# Patient Record
Sex: Female | Born: 1985
Health system: Southern US, Community
[De-identification: ages and names within clinical notes are randomized; demographics above are authoritative.]

## PROBLEM LIST (undated history)

## (undated) DIAGNOSIS — O24419 Gestational diabetes mellitus in pregnancy, unspecified control: Secondary | ICD-10-CM

## (undated) HISTORY — DX: Gestational diabetes mellitus in pregnancy, unspecified control: O24.419

---

## 2016-04-23 HISTORY — PX: CHOLECYSTECTOMY: SHX55

## 2017-12-05 ENCOUNTER — Other Ambulatory Visit (HOSPITAL_COMMUNITY)
Admission: RE | Admit: 2017-12-05 | Discharge: 2017-12-05 | Disposition: A | Discharge status: Home / Self Care | Payer: No Typology Code available for payment source | Source: Ambulatory Visit | Attending: Nurse Practitioner | Admitting: Nurse Practitioner

## 2017-12-05 ENCOUNTER — Other Ambulatory Visit: Payer: Self-pay | Admitting: Nurse Practitioner

## 2017-12-05 DIAGNOSIS — Z01419 Encounter for gynecological examination (general) (routine) without abnormal findings: Secondary | ICD-10-CM | POA: Insufficient documentation

## 2017-12-09 LAB — CYTOLOGY - PAP
Diagnosis: NEGATIVE
HPV (WINDOPATH): NOT DETECTED

## 2018-04-23 NOTE — L&D Delivery Note (Signed)
Delivery Note At 10:31 AM a viable female was delivered via Vaginal, Spontaneous (Presentation:   Left Occiput Anterior).  APGAR: 9, 9; weight pending.   Placenta status: Spontaneous, Intact.  Cord: 3 vessels with the following complications: nuchal x1  Anesthesia: None Episiotomy: None Lacerations:  none Suture Repair: n/a Est. Blood Loss (mL): 50  Mom to postpartum.  Baby to Couplet care / Skin to Skin.  Wende Mott CNM 04/19/2019, 10:58 AM

## 2018-07-15 ENCOUNTER — Ambulatory Visit (INDEPENDENT_AMBULATORY_CARE_PROVIDER_SITE_OTHER): Payer: Self-pay | Admitting: Physician Assistant

## 2018-07-15 ENCOUNTER — Encounter: Payer: Self-pay | Admitting: Physician Assistant

## 2018-07-15 VITALS — BP 100/60 | HR 83 | Temp 97.8°F | Resp 14 | Wt 103.0 lb

## 2018-07-15 DIAGNOSIS — R059 Cough, unspecified: Secondary | ICD-10-CM

## 2018-07-15 DIAGNOSIS — R05 Cough: Secondary | ICD-10-CM

## 2018-07-15 MED ORDER — PSEUDOEPH-BROMPHEN-DM 30-2-10 MG/5ML PO SYRP: 5.0000 mL | ORAL_SOLUTION | Freq: Four times a day (QID) | ORAL | 0 refills | Status: DC | PRN

## 2018-07-15 MED ORDER — BENZONATATE 100 MG PO CAPS: 100.0000 mg | ORAL_CAPSULE | Freq: Three times a day (TID) | ORAL | 0 refills | Status: DC | PRN

## 2018-07-15 MED ORDER — AZITHROMYCIN 250 MG PO TABS: ORAL_TABLET | ORAL | 0 refills | Status: DC

## 2018-07-15 NOTE — Patient Instructions (Signed)
Cough, Adult  Start bromfed and tessalon perles for your cough.  Continue to rest, hydrate, and eat light meals.  Stay away from others until you are completely free of your symptoms for at least 3 days.   If no improvement in cough in 5-7 days, I have given you a prescription for an antibiotic to take. Try symptomatic treatment prescribed first. If you develop any new fever, shortness of breath, or chest pain, please seek care at ED.    A cough helps to clear your throat and lungs. A cough may last only 2-3 weeks (acute), or it may last longer than 8 weeks (chronic). Many different things can cause a cough. A cough may be a sign of an illness or another medical condition. Follow these instructions at home:  Pay attention to any changes in your cough.  Take medicines only as told by your doctor. ? If you were prescribed an antibiotic medicine, take it as told by your doctor. Do not stop taking it even if you start to feel better. ? Talk with your doctor before you try using a cough medicine.  Drink enough fluid to keep your pee (urine) clear or pale yellow.  If the air is dry, use a cold steam vaporizer or humidifier in your home.  Stay away from things that make you cough at work or at home.  If your cough is worse at night, try using extra pillows to raise your head up higher while you sleep.  Do not smoke, and try not to be around smoke. If you need help quitting, ask your doctor.  Do not have caffeine.  Do not drink alcohol.  Rest as needed. Contact a doctor if:  You have new problems (symptoms).  You cough up yellow fluid (pus).  Your cough does not get better after 2-3 weeks, or your cough gets worse.  Medicine does not help your cough and you are not sleeping well.  You have pain that gets worse or pain that is not helped with medicine.  You have a fever.  You are losing weight and you do not know why.  You have night sweats. Get help right away if:  You  cough up blood.  You have trouble breathing.  Your heartbeat is very fast. This information is not intended to replace advice given to you by your health care provider. Make sure you discuss any questions you have with your health care provider. Document Released: 12/21/2010 Document Revised: 09/15/2015 Document Reviewed: 06/16/2014 Elsevier Interactive Patient Education  2019 [employer].

## 2018-07-15 NOTE — Progress Notes (Signed)
MRN: [id number] DOB: [birth year]  Subjective:   Lisa Fleming is a 33 y.o. female presenting for chief complaint of Cough . Pt reports having flu like symptoms with fever, body aches, chills, cough, and runny nose  ~2 weeks ago.  She was visiting family in [place] when this occurred (traveled there via car, not airline).  The kids she was visiting were sick with similar symptoms, went to the ED and were diagnosed with the flu.  Notes she felt significantly better after about 3 days.  All of her symptoms resolved except for the cough.  Cough has lingered over the past 1.5 weeks but has lessened in frequency and severity.  Cough is dry.  Most notable at nighttime.  Denies any fever, shortness of breath, difficulty breathing, chest pain, wheezing, sinus pain, sore throat, inability to swallow, voice change and productive cough, nausea, vomiting, abdominal pain and diarrhea.  Has tried over-the-counter Delsym once and it did help.  Denies any other recent travel or exposure to confirmed covid-19 patient. Denies smoking.  Denies history of asthma, COPD, heart disease, renal disease, diabetes, hypertension, and autoimmune disease.  Accompanied by husband and daughter.  No notes at home is sick with similar symptoms.  LMP ~06/25/2018.  Denies any other aggravating or relieving factors, no other questions or concerns.  Review of Systems  Constitutional: Negative for diaphoresis.  HENT: Negative for congestion and ear pain.   Respiratory: Negative for hemoptysis and sputum production.   Musculoskeletal: Negative for back pain.  Skin: Negative for rash.  Neurological: Negative for dizziness and headaches.    Saanvi has a current medication list which includes the following prescription(s): azithromycin, benzonatate, and brompheniramine-pseudoephedrine-dm. Also has No Known Allergies.  Milyn  has no past medical history on file. Also  has no past surgical history on file.   Objective:   Vitals: BP  100/60   Pulse 83   Temp 97.8 F (36.6 C)   Resp 14   Wt 103 lb (46.7 kg)   LMP 06/25/2018 (Approximate)   SpO2 96%   Physical Exam Vitals signs reviewed.  Constitutional:      General: She is not in acute distress.    Appearance: She is well-developed. She is not ill-appearing or toxic-appearing.  HENT:     Head: Normocephalic and atraumatic.     Right Ear: Tympanic membrane, ear canal and external ear normal.     Left Ear: Tympanic membrane, ear canal and external ear normal.     Nose: Nose normal.     Right Sinus: No maxillary sinus tenderness or frontal sinus tenderness.     Left Sinus: No maxillary sinus tenderness or frontal sinus tenderness.     Mouth/Throat:     Lips: Pink.     Mouth: Mucous membranes are moist.     Pharynx: Oropharynx is clear. Uvula midline. No posterior oropharyngeal erythema.     Tonsils: No tonsillar exudate or tonsillar abscesses.  Eyes:     Conjunctiva/sclera: Conjunctivae normal.  Neck:     Musculoskeletal: Normal range of motion.  Cardiovascular:     Rate and Rhythm: Normal rate and regular rhythm.     Heart sounds: Normal heart sounds.  Pulmonary:     Effort: Pulmonary effort is normal.     Breath sounds: Normal breath sounds. No decreased breath sounds, wheezing, rhonchi or rales.  Chest:     Comments: No coughing noted during exam.  Lymphadenopathy:     Head:     Right side  of head: No submental, submandibular, tonsillar, preauricular, posterior auricular or occipital adenopathy.     Left side of head: No submental, submandibular, tonsillar, preauricular, posterior auricular or occipital adenopathy.     Cervical: No cervical adenopathy.     Upper Body:     Right upper body: No supraclavicular adenopathy.     Left upper body: No supraclavicular adenopathy.  Skin:    General: Skin is warm and dry.  Neurological:     Mental Status: She is alert.     No results found for this or any previous visit (from the past 24  hour(s)).  Assessment and Plan :  1. Cough Pt is overall well-appearing, no acute distress.  VSS.  She is afebrile. She has not coughed once since being in the office.  Considering close exposure to flu, suspect she initially had flu vs less likely covid-19. She denies any SOB or chest pain. Lungs CTAB.  History is more consistent with bronchitis versus secondary sickening from influenza.  Reassuring that symptoms are improving.  Would recommend symptomatic treatment at this time as bronchitis can last up to 3 weeks and she has not consistently tried symptomatic management.  If no improvement with symptomatic management in 5 to 7 days, have provided patient with a printed Rx for azithromycin to cover for potential atypical bacterial etiology.  Encouraged patient to stay home and away from others until she is completely asymptomatic for 3 days.  If no full improvement with treatment plan, would recommend contacting PCP.  Advised to seek care immediately at ED if symptoms worsen or she develops new shortness of breath, chest pain, or other concerning symptoms.  Patient voices her understanding. - brompheniramine-pseudoephedrine-DM 30-2-10 MG/5ML syrup; Take 5 mLs by mouth 4 (four) times daily as needed.  Dispense: 120 mL; Refill: 0 - benzonatate (TESSALON) 100 MG capsule; Take 1-2 capsules (100-200 mg total) by mouth 3 (three) times daily as needed for cough.  Dispense: 40 capsule; Refill: 0 - azithromycin (ZITHROMAX) 250 MG tablet; Take 2 tabs PO x 1 dose, then 1 tab PO QD x 4 days  Dispense: 6 tablet; Refill: 0    Benjiman Core, Cordelia Poche  [facility] 07/15/2018 2:11 PM

## 2018-07-17 ENCOUNTER — Telehealth: Payer: Self-pay

## 2018-07-17 NOTE — Telephone Encounter (Signed)
Not valid number

## 2018-09-01 ENCOUNTER — Other Ambulatory Visit: Payer: Self-pay

## 2018-09-01 ENCOUNTER — Inpatient Hospital Stay (HOSPITAL_COMMUNITY): Payer: No Typology Code available for payment source

## 2018-09-01 ENCOUNTER — Inpatient Hospital Stay (HOSPITAL_COMMUNITY)
Admission: AD | Admit: 2018-09-01 | Discharge: 2018-09-01 | Disposition: A | Discharge status: Home / Self Care | Payer: No Typology Code available for payment source | Attending: Obstetrics and Gynecology | Admitting: Obstetrics and Gynecology

## 2018-09-01 ENCOUNTER — Encounter (HOSPITAL_COMMUNITY): Payer: Self-pay

## 2018-09-01 DIAGNOSIS — R1031 Right lower quadrant pain: Secondary | ICD-10-CM | POA: Diagnosis not present

## 2018-09-01 DIAGNOSIS — R109 Unspecified abdominal pain: Secondary | ICD-10-CM

## 2018-09-01 DIAGNOSIS — Z3A01 Less than 8 weeks gestation of pregnancy: Secondary | ICD-10-CM | POA: Diagnosis not present

## 2018-09-01 DIAGNOSIS — Z3491 Encounter for supervision of normal pregnancy, unspecified, first trimester: Secondary | ICD-10-CM

## 2018-09-01 DIAGNOSIS — O26891 Other specified pregnancy related conditions, first trimester: Secondary | ICD-10-CM | POA: Diagnosis not present

## 2018-09-01 LAB — OB RESULTS CONSOLE RUBELLA ANTIBODY, IGM: Rubella: IMMUNE

## 2018-09-01 LAB — OB RESULTS CONSOLE GC/CHLAMYDIA
Chlamydia: NEGATIVE
Gonorrhea: NEGATIVE

## 2018-09-01 LAB — CBC
HCT: 42.2 % (ref 36.0–46.0)
Hemoglobin: 14.4 g/dL (ref 12.0–15.0)
MCH: 28.9 pg (ref 26.0–34.0)
MCHC: 34.1 g/dL (ref 30.0–36.0)
MCV: 84.7 fL (ref 80.0–100.0)
Platelets: 183 10*3/uL (ref 150–400)
RBC: 4.98 MIL/uL (ref 3.87–5.11)
RDW: 12.4 % (ref 11.5–15.5)
WBC: 3.9 10*3/uL — ABNORMAL LOW (ref 4.0–10.5)
nRBC: 0 % (ref 0.0–0.2)

## 2018-09-01 LAB — OB RESULTS CONSOLE ANTIBODY SCREEN: Antibody Screen: NEGATIVE

## 2018-09-01 LAB — OB RESULTS CONSOLE RPR: RPR: NONREACTIVE

## 2018-09-01 LAB — OB RESULTS CONSOLE ABO/RH: RH Type: POSITIVE

## 2018-09-01 LAB — OB RESULTS CONSOLE HIV ANTIBODY (ROUTINE TESTING): HIV: NONREACTIVE

## 2018-09-01 LAB — OB RESULTS CONSOLE VARICELLA ZOSTER ANTIBODY, IGG: Varicella: NON-IMMUNE/NOT IMMUNE

## 2018-09-01 LAB — HCG, QUANTITATIVE, PREGNANCY: hCG, Beta Chain, Quant, S: 62658 m[IU]/mL — ABNORMAL HIGH (ref <5)

## 2018-09-01 LAB — OB RESULTS CONSOLE HEPATITIS B SURFACE ANTIGEN: Hepatitis B Surface Ag: NEGATIVE

## 2018-09-01 NOTE — MAU Provider Note (Signed)
Chief Complaint: Abdominal Pain   First Provider Initiated Contact with Patient 09/01/18 1654    *Arabic interpreter via video used for this visit*   SUBJECTIVE HPI: Lisa Fleming is a 33 y.o. [id number] at [redacted]w[redacted]d who presents to [facility] reporting abdominal pain. Symptoms started 2 weeks ago. Reports constant abdominal pain in her RLQ. Has been taking ibuprofen with some relief. Denies n/v/d, constipation, dysuria, fever/chills, vaginal discharge, or vaginal bleeding.   Location: abdomen Quality: cramping Severity: 7/10 on pain scale Duration: 2 weeks Timing: constant Modifying factors: improved with ibuprofen. Worse at night.  Associated signs and symptoms: none  History reviewed. No pertinent past medical history. OB History  Gravida Para Term Preterm AB Living  SAB TAB Ectopic Multiple Live Births               # Outcome Date GA Lbr Len/2nd Weight Sex Delivery Anes PTL Lv  4 Current           3 Term           2 Term           1 Term            Past Surgical History:  Procedure Laterality Date  . CHOLECYSTECTOMY  2018   Social History   Socioeconomic History  . Marital status: Unknown    Spouse name: Not on file  . Number of children: Not on file  . Years of education: Not on file  . Highest education level: Not on file  Occupational History  . Not on file  Social Needs  . Financial resource strain: Not on file  . Food insecurity:    Worry: Not on file    Inability: Not on file  . Transportation needs:    Medical: Not on file    Non-medical: Not on file  Tobacco Use  . Smoking status: Never Smoker  . Smokeless tobacco: Never Used  Substance and Sexual Activity  . Alcohol use: Never    Frequency: Never  . Drug use: Never  . Sexual activity: Yes  Lifestyle  . Physical activity:    Days per week: Not on file    Minutes per session: Not on file  . Stress: Not on file  Relationships  . Social connections:    Talks on phone: Not on  file    Gets together: Not on file    Attends religious service: Not on file    Active member of club or organization: Not on file    Attends meetings of clubs or organizations: Not on file    Relationship status: Not on file  . Intimate partner violence:    Fear of current or ex partner: Not on file    Emotionally abused: Not on file    Physically abused: Not on file    Forced sexual activity: Not on file  Other Topics Concern  . Not on file  Social History Narrative  . Not on file   Family History  Problem Relation Age of Onset  . Diabetes Mother    No current facility-administered medications on file prior to encounter.    No current outpatient medications on file prior to encounter.   No Known Allergies  I have reviewed patient's Past Medical Hx, Surgical Hx, Family Hx, Social Hx, medications and allergies.   Review of Systems  Constitutional: Negative.   Gastrointestinal: Positive for abdominal pain. Negative for constipation, diarrhea,  nausea and vomiting.  Genitourinary: Negative.     OBJECTIVE Patient Vitals for the past 24 hrs:  BP Temp Temp src Pulse Resp SpO2 Weight  09/01/18 1917 (!) 122/55 - - (!) 102 17 - -  09/01/18 1634 124/71 98.5 F (36.9 C) Oral 99 16 100 % 46.8 kg  09/01/18 1542 109/70 98.3 F (36.8 C) Oral 99 14 100 % -   Constitutional: Well-developed, well-nourished female in no acute distress.  Cardiovascular: normal rate & rhythm, no murmur Respiratory: normal rate and effort. Lung sounds clear throughout GI: Abd soft, non-tender, Pos BS x 4. No guarding or rebound tenderness MS: Extremities nontender, no edema, normal ROM Neurologic: Alert and oriented x 4.     LAB RESULTS Results for orders placed or performed during the hospital encounter of 09/01/18 (from the past 24 hour(s))  CBC     Status: Abnormal   Collection Time: 09/01/18  5:18 PM  Result Value Ref Range   WBC 3.9 (L) 4.0 - 10.5 K/uL   RBC 4.98 3.87 - 5.11 MIL/uL    Hemoglobin 14.4 12.0 - 15.0 g/dL   HCT 05.3 97.6 - 73.4 %   MCV 84.7 80.0 - 100.0 fL   MCH 28.9 26.0 - 34.0 pg   MCHC 34.1 30.0 - 36.0 g/dL   RDW 19.3 79.0 - 24.0 %   Platelets 183 150 - 400 K/uL   nRBC 0.0 0.0 - 0.2 %  ABO/Rh     Status: None   Collection Time: 09/01/18  5:18 PM  Result Value Ref Range   ABO/RH(D)      O POS Performed at [facility], [address], [place], [place] [address]   hCG, quantitative, pregnancy     Status: Abnormal   Collection Time: 09/01/18  5:18 PM  Result Value Ref Range   hCG, Beta Chain, Quant, S 62,658 (H) <5 mIU/mL    IMAGING US Ob Less Than 14 Weeks With Ob Transvaginal  Result Date: 09/01/2018 CLINICAL DATA:  Pregnant patient with right lower quadrant pain for 2 weeks. EXAM: OBSTETRIC <14 WK [place] AND TRANSVAGINAL OB US TECHNIQUE: Both transabdominal and transvaginal ultrasound examinations were performed for complete evaluation of the gestation as well as the maternal uterus, adnexal regions, and pelvic cul-de-sac. Transvaginal technique was performed to assess early pregnancy. COMPARISON:  None. FINDINGS: Intrauterine gestational sac: Single. Yolk sac:  Visualized. Embryo:  Visualized. Cardiac Activity: Detected. Heart Rate: 129 bpm CRL:  4.67 mm   6 w   1 d                  [place] EDC: 04/26/2019. Subchorionic hemorrhage:  None visualized. Maternal uterus/adnexae: Small corpus luteum cyst on the right incidentally noted. IMPRESSION: Single living intrauterine pregnancy.  No acute abnormality. Electronically Signed   By: Drusilla Kanner M.D.   On: 09/01/2018 18:55    MAU COURSE Orders Placed This Encounter  Procedures  . US OB LESS THAN 14 WEEKS WITH OB TRANSVAGINAL  . CBC  . hCG, quantitative, pregnancy  . ABO/Rh  . Discharge patient   No orders of the defined types were placed in this encounter.   MDM +UPT UA, CBC, ABO/Rh, quant hCG, and [place] today to rule out ectopic pregnancy  Ultrasound shows live IUP  Pt afebrile & no  leukocytosis  ASSESSMENT 1. Normal IUP (intrauterine pregnancy) on prenatal ultrasound, first trimester   2. Abdominal pain during pregnancy in first trimester     PLAN Discharge home  in stable condition. SAB precautions Start prenatal care - given list of providers  Allergies as of 09/01/2018   No Known Allergies     Medication List    STOP taking these medications   azithromycin 250 MG tablet Commonly known as:  ZITHROMAX   benzonatate 100 MG capsule Commonly known as:  TESSALON   brompheniramine-pseudoephedrine-DM 30-2-10 MG/5ML syrup        Judeth HornLawrence, Tashara Suder, NP 09/01/2018  7:37 PM

## 2018-09-01 NOTE — Discharge Instructions (Signed)
[facility] Providers   Center for [facility]       Phone: [phone]  Center for [facility] [place] Phone: [phone]  [facility] [place]  Phone: [phone]  Center for [facility]  Phone: [phone]  Center for [facility]  Phone: [phone]  [facility]       Phone: [phone]   [facility]    Phone: [phone]   [facility]    Phone: [phone]  [facility]    Phone: [phone]  [facility]    Phone: [phone]   [facility]  Phone: [phone]  [facility]    Phone: [phone]  [facility]   Phone: [phone]  [facility]    Phone: [phone]       First Trimester of Pregnancy  The first trimester of pregnancy is from week 1 until the end of week 13 (months 1 through 3). During this time, your baby will begin to develop inside you. At 6-8 weeks, the eyes and face are formed, and the heartbeat can be seen on ultrasound. At the end of 12 weeks, all the baby's organs are formed. Prenatal care is all the medical care you receive before the birth of your baby. Make sure you get good prenatal care and follow all of your doctor's instructions. Follow these instructions at home: Medicines  Take over-the-counter and prescription medicines only as told by your doctor. Some medicines are safe and some medicines are not safe during pregnancy.  Take a prenatal vitamin that contains at least 600 micrograms (mcg) of folic acid.  If you have trouble pooping (constipation), take medicine that will make your stool soft (stool softener) if your doctor approves. Eating and drinking   Eat regular, healthy meals.  Your doctor will  tell you the amount of weight gain that is right for you.  Avoid raw meat and uncooked cheese.  If you feel sick to your stomach (nauseous) or throw up (vomit): ? Eat 4 or 5 small meals a day instead of 3 large meals. ? Try eating a few soda crackers. ? Drink liquids between meals instead of during meals.  To prevent constipation: ? Eat foods that are high in fiber, like fresh fruits and vegetables, whole grains, and beans. ? Drink enough fluids to keep your pee (urine) clear or pale yellow. Activity  Exercise only as told by your doctor. Stop exercising if you have cramps or pain in your lower belly (abdomen) or low back.  Do not exercise if it is too hot, too humid, or if you are in a place of great height (high altitude).  Try to avoid standing for long periods of time. Move your legs often if you must stand in one place for a long time.  Avoid heavy lifting.  Wear low-heeled shoes. Sit and stand up straight.  You can have sex unless your doctor tells you not to. Relieving pain and discomfort  Wear a good support bra if your breasts are sore.  Take warm water baths (sitz baths) to soothe pain or discomfort caused by hemorrhoids. Use hemorrhoid cream if your doctor says it is okay.  Rest with your legs raised if you have leg cramps or low back pain.  If you have puffy, bulging veins (varicose veins) in your legs: ? Wear support hose  or compression stockings as told by your doctor. ? Raise (elevate) your feet for 15 minutes, 3-4 times a day. ? Limit salt in your food. Prenatal care  Schedule your prenatal visits by the twelfth week of pregnancy.  Write down your questions. Take them to your prenatal visits.  Keep all your prenatal visits as told by your doctor. This is important. Safety  Wear your seat belt at all times when driving.  Make a list of emergency phone numbers. The list should include numbers for family, friends, the hospital, and police and fire  departments. General instructions  Ask your doctor for a referral to a local prenatal class. Begin classes no later than at the start of month 6 of your pregnancy.  Ask for help if you need counseling or if you need help with nutrition. Your doctor can give you advice or tell you where to go for help.  Do not use hot tubs, steam rooms, or saunas.  Do not douche or use tampons or scented sanitary pads.  Do not cross your legs for long periods of time.  Avoid all herbs and alcohol. Avoid drugs that are not approved by your doctor.  Do not use any tobacco products, including cigarettes, chewing tobacco, and electronic cigarettes. If you need help quitting, ask your doctor. You may get counseling or other support to help you quit.  Avoid cat litter boxes and soil used by cats. These carry germs that can cause birth defects in the baby and can cause a loss of your baby (miscarriage) or stillbirth.  Visit your dentist. At home, brush your teeth with a soft toothbrush. Be gentle when you floss. Contact a doctor if:  You are dizzy.  You have mild cramps or pressure in your lower belly.  You have a nagging pain in your belly area.  You continue to feel sick to your stomach, you throw up, or you have watery poop (diarrhea).  You have a bad smelling fluid coming from your vagina.  You have pain when you pee (urinate).  You have increased puffiness (swelling) in your face, hands, legs, or ankles. Get help right away if:  You have a fever.  You are leaking fluid from your vagina.  You have spotting or bleeding from your vagina.  You have very bad belly cramping or pain.  You gain or lose weight rapidly.  You throw up blood. It may look like coffee grounds.  You are around people who have [place] measles, fifth disease, or chickenpox.  You have a very bad headache.  You have shortness of breath.  You have any kind of trauma, such as from a fall or a car  accident. Summary  The first trimester of pregnancy is from week 1 until the end of week 13 (months 1 through 3).  To take care of yourself and your unborn baby, you will need to eat healthy meals, take medicines only if your doctor tells you to do so, and do activities that are safe for you and your baby.  Keep all follow-up visits as told by your doctor. This is important as your doctor will have to ensure that your baby is healthy and growing well. This information is not intended to replace advice given to you by your health care provider. Make sure you discuss any questions you have with your health care provider. Document Released: 09/26/2007 Document Revised: 04/17/2016 Document Reviewed: 04/17/2016 Elsevier Interactive Patient Education  2019 [employer].

## 2018-09-01 NOTE — MAU Note (Signed)
Pt sent from [place] to rule out ectopic. RLQ pain, positive test at office. Had pain for 2 weeks, rates 7/10. No bleeding.

## 2018-09-02 LAB — ABO/RH: ABO/RH(D): O POS

## 2019-02-17 ENCOUNTER — Encounter: Payer: Self-pay | Admitting: *Deleted

## 2019-02-19 ENCOUNTER — Ambulatory Visit (INDEPENDENT_AMBULATORY_CARE_PROVIDER_SITE_OTHER): Payer: Medicaid Other | Admitting: Obstetrics and Gynecology

## 2019-02-19 ENCOUNTER — Other Ambulatory Visit: Payer: No Typology Code available for payment source

## 2019-02-19 ENCOUNTER — Encounter: Payer: Self-pay | Admitting: General Practice

## 2019-02-19 ENCOUNTER — Other Ambulatory Visit: Payer: Self-pay

## 2019-02-19 ENCOUNTER — Encounter: Payer: No Typology Code available for payment source | Admitting: Obstetrics and Gynecology

## 2019-02-19 DIAGNOSIS — Z3A31 31 weeks gestation of pregnancy: Secondary | ICD-10-CM | POA: Diagnosis not present

## 2019-02-19 DIAGNOSIS — O09293 Supervision of pregnancy with other poor reproductive or obstetric history, third trimester: Secondary | ICD-10-CM | POA: Diagnosis present

## 2019-02-19 DIAGNOSIS — O09523 Supervision of elderly multigravida, third trimester: Secondary | ICD-10-CM | POA: Diagnosis not present

## 2019-02-19 DIAGNOSIS — Z23 Encounter for immunization: Secondary | ICD-10-CM | POA: Diagnosis not present

## 2019-02-19 DIAGNOSIS — O09529 Supervision of elderly multigravida, unspecified trimester: Secondary | ICD-10-CM | POA: Insufficient documentation

## 2019-02-19 DIAGNOSIS — O09299 Supervision of pregnancy with other poor reproductive or obstetric history, unspecified trimester: Secondary | ICD-10-CM

## 2019-02-19 DIAGNOSIS — O0990 Supervision of high risk pregnancy, unspecified, unspecified trimester: Secondary | ICD-10-CM | POA: Insufficient documentation

## 2019-02-19 DIAGNOSIS — Z3400 Encounter for supervision of normal first pregnancy, unspecified trimester: Secondary | ICD-10-CM

## 2019-02-19 DIAGNOSIS — Z8632 Personal history of gestational diabetes: Secondary | ICD-10-CM | POA: Insufficient documentation

## 2019-02-19 NOTE — Progress Notes (Signed)
[id number] Lisa Fleming

## 2019-02-19 NOTE — Progress Notes (Signed)
History:   Lisa Fleming is a 33 y.o. [id number] at [redacted]w[redacted]d by LMP being seen today for her first obstetrical visit.  Her obstetrical history is significant for advanced maternal age, GDM with 3rd baby.  Patient does intend to breast feed. Pregnancy history fully reviewed.  3 girls at home. Transfer of care from [employer]; She is unsure why she transferred, states it was likely her husbands [place].    Patient reports no complaints. Interpretor used for visit.   HISTORY: OB History  Gravida Para Term Preterm AB Living  5 3 3  0 1 3  SAB TAB Ectopic Multiple Live Births  1 0 0 0 3    # Outcome Date GA Lbr Len/2nd Weight Sex Delivery Anes PTL Lv  5 Current           4 Term 10/26/11 [redacted]w[redacted]d  6 lb (2.722 kg) F Vag-Spont   LIV  3 Term 12/15/04 [redacted]w[redacted]d  6 lb (2.722 kg) F Vag-Spont   LIV  2 Term 10/15/03 [redacted]w[redacted]d  7 lb (3.175 kg) F Vag-Spont   LIV  1 SAB             Last pap smear was done 2019 and was normal  No past medical history on file. Past Surgical History:  Procedure Laterality Date  . CHOLECYSTECTOMY  2018   Family History  Problem Relation Age of Onset  . Diabetes Mother   . Heart disease Father    Social History   Tobacco Use  . Smoking status: Never Smoker  . Smokeless tobacco: Never Used  Substance Use Topics  . Alcohol use: Never    Frequency: Never  . Drug use: Never   No Known Allergies No current outpatient medications on file prior to visit.   No current facility-administered medications on file prior to visit.     Review of Systems Pertinent items noted in HPI and remainder of comprehensive ROS otherwise negative. Physical Exam:   Vitals:   02/19/19 1506  BP: 105/61  Pulse: 89  Weight: 123 lb (55.8 kg)   Fetal Heart Rate (bpm): 154 Uterus:  Fundal Height: 29 cm  System: General: well-developed, well-nourished female in no acute distress   Breasts:  normal appearance, no masses or tenderness bilaterally   Skin: normal coloration and turgor, no  rashes   Neurologic: oriented, normal, negative, normal mood   Extremities: normal strength, tone, and muscle mass, ROM of all joints is normal   HEENT PERRLA, extraocular movement intact and sclera clear, anicteric   Mouth/Teeth mucous membranes moist, pharynx normal without lesions and dental hygiene good   Neck supple and no masses   Cardiovascular: regular rate and rhythm   Respiratory:  no respiratory distress, normal breath sounds   Abdomen: soft, non-tender; bowel sounds normal; no masses,  no organomegaly    Assessment:    Pregnancy: [id number] Patient Active Problem List   Diagnosis Date Noted  . Limited prenatal care, antepartum 02/20/2019  . Supervision of low-risk first pregnancy 02/19/2019  . History of gestational diabetes in prior pregnancy, currently pregnant 02/19/2019  . AMA (advanced maternal age) multigravida 35+ 02/19/2019     Plan:   1. Encounter for supervision of low-risk first pregnancy, antepartum   2. History of gestational diabetes in prior pregnancy, currently pregnant  Patient to return tomorrow for 2 hour GTT.   3. Antepartum multigravida of advanced maternal age  - HgB A1c - [place] MFM OB COMP + 14 WK; Future   Initial  labs drawn. Continue prenatal vitamins. Genetic Screening discussed, First trimester screen, Quad screen and NIPS: declined. Ultrasound discussed; fetal anatomic survey: requested. Problem list reviewed and updated. The nature of [facility] with multiple MDs and other Advanced Practice Providers was explained to patient; also emphasized that residents, students are part of our team. Routine obstetric precautions reviewed. Return in about 1 day (around 02/20/2019), or Return tomorrow for 2 hour GTT.   Lisa, Lisa Rutherford, NP    [facility] for [employer], [facility]

## 2019-02-20 ENCOUNTER — Other Ambulatory Visit: Payer: No Typology Code available for payment source

## 2019-02-20 ENCOUNTER — Encounter: Payer: Self-pay | Admitting: Obstetrics and Gynecology

## 2019-02-20 DIAGNOSIS — Z3400 Encounter for supervision of normal first pregnancy, unspecified trimester: Secondary | ICD-10-CM

## 2019-02-20 DIAGNOSIS — O0930 Supervision of pregnancy with insufficient antenatal care, unspecified trimester: Secondary | ICD-10-CM | POA: Insufficient documentation

## 2019-02-20 LAB — HEMOGLOBIN A1C
Est. average glucose Bld gHb Est-mCnc: 108 mg/dL
Hgb A1c MFr Bld: 5.4 % (ref 4.8–5.6)

## 2019-02-21 LAB — GLUCOSE TOLERANCE, 2 HOURS W/ 1HR
Glucose, 1 hour: 192 mg/dL — ABNORMAL HIGH (ref 65–179)
Glucose, 2 hour: 137 mg/dL (ref 65–152)
Glucose, Fasting: 76 mg/dL (ref 65–91)

## 2019-02-23 ENCOUNTER — Encounter: Payer: Self-pay | Admitting: Obstetrics and Gynecology

## 2019-02-23 DIAGNOSIS — O24419 Gestational diabetes mellitus in pregnancy, unspecified control: Secondary | ICD-10-CM | POA: Insufficient documentation

## 2019-03-03 ENCOUNTER — Other Ambulatory Visit: Payer: No Typology Code available for payment source

## 2019-03-03 ENCOUNTER — Telehealth: Payer: Self-pay | Admitting: Family Medicine

## 2019-03-03 NOTE — Telephone Encounter (Signed)
Husband called to say they will call back at a later time to reschedule appointment. She did not have transportation today.

## 2019-03-06 ENCOUNTER — Ambulatory Visit (HOSPITAL_COMMUNITY)
Admission: RE | Admit: 2019-03-06 | Discharge: 2019-03-06 | Disposition: A | Discharge status: Home / Self Care | Payer: Medicaid Other | Source: Ambulatory Visit | Attending: Obstetrics and Gynecology | Admitting: Obstetrics and Gynecology

## 2019-03-06 ENCOUNTER — Other Ambulatory Visit: Payer: Self-pay | Admitting: Obstetrics and Gynecology

## 2019-03-06 ENCOUNTER — Other Ambulatory Visit: Payer: Self-pay

## 2019-03-06 ENCOUNTER — Other Ambulatory Visit (HOSPITAL_COMMUNITY): Payer: Self-pay | Admitting: *Deleted

## 2019-03-06 DIAGNOSIS — Z362 Encounter for other antenatal screening follow-up: Secondary | ICD-10-CM

## 2019-03-06 DIAGNOSIS — O09523 Supervision of elderly multigravida, third trimester: Secondary | ICD-10-CM

## 2019-03-06 DIAGNOSIS — O09293 Supervision of pregnancy with other poor reproductive or obstetric history, third trimester: Secondary | ICD-10-CM | POA: Diagnosis not present

## 2019-03-06 DIAGNOSIS — Z3A32 32 weeks gestation of pregnancy: Secondary | ICD-10-CM

## 2019-03-06 DIAGNOSIS — O0933 Supervision of pregnancy with insufficient antenatal care, third trimester: Secondary | ICD-10-CM | POA: Diagnosis not present

## 2019-03-06 DIAGNOSIS — O09529 Supervision of elderly multigravida, unspecified trimester: Secondary | ICD-10-CM | POA: Diagnosis present

## 2019-03-11 ENCOUNTER — Telehealth: Payer: Self-pay | Admitting: Family Medicine

## 2019-03-11 NOTE — Telephone Encounter (Signed)
Attempted to contact patient about her appointment on 11/19 @ 8:35. Patient's husband answered the phone and stated that he will let his wife no that she has to wear a face mask and no visitors are allowed. Patient's husband was asked if she had any covid symptoms and he denied that she had any.

## 2019-03-12 ENCOUNTER — Other Ambulatory Visit: Payer: Self-pay

## 2019-03-12 ENCOUNTER — Ambulatory Visit (INDEPENDENT_AMBULATORY_CARE_PROVIDER_SITE_OTHER): Payer: No Typology Code available for payment source | Admitting: Family Medicine

## 2019-03-12 VITALS — BP 102/62 | HR 91 | Wt 123.7 lb

## 2019-03-12 DIAGNOSIS — O0990 Supervision of high risk pregnancy, unspecified, unspecified trimester: Secondary | ICD-10-CM

## 2019-03-12 DIAGNOSIS — O0993 Supervision of high risk pregnancy, unspecified, third trimester: Secondary | ICD-10-CM

## 2019-03-12 DIAGNOSIS — O24419 Gestational diabetes mellitus in pregnancy, unspecified control: Secondary | ICD-10-CM

## 2019-03-12 DIAGNOSIS — O09523 Supervision of elderly multigravida, third trimester: Secondary | ICD-10-CM

## 2019-03-12 DIAGNOSIS — Z3A33 33 weeks gestation of pregnancy: Secondary | ICD-10-CM

## 2019-03-12 MED ORDER — ACCU-CHEK FASTCLIX LANCETS MISC: 1.0000 | Freq: Four times a day (QID) | 12 refills | Status: DC

## 2019-03-12 MED ORDER — ACCU-CHEK GUIDE VI STRP: ORAL_STRIP | 12 refills | Status: DC

## 2019-03-12 MED ORDER — ACCU-CHEK GUIDE ME W/DEVICE KIT: 1.0000 [IU] | PACK | 0 refills | Status: DC | PRN

## 2019-03-12 NOTE — Progress Notes (Signed)
Interpreter Sarra G. 

## 2019-03-12 NOTE — Patient Instructions (Signed)

## 2019-03-12 NOTE — Progress Notes (Signed)
   PRENATAL VISIT NOTE  Subjective:  Lisa Fleming is a 33 y.o. [id number] at 7w4dbeing seen today for ongoing prenatal care.  She is currently monitored for the following issues for this high-risk pregnancy and has Supervision of high risk pregnancy, antepartum; History of gestational diabetes in prior pregnancy, currently pregnant; AMA (advanced maternal age) multigravida 361+ Limited prenatal care, antepartum; and Gestational diabetes mellitus (GDM) affecting pregnancy on their problem list.  Patient reports no complaints.  Contractions: Not present. Vag. Bleeding: None.  Movement: Present. Denies leaking of fluid.   The following portions of the patient's history were reviewed and updated as appropriate: allergies, current medications, past family history, past medical history, past social history, past surgical history and problem list.   Objective:   Vitals:   03/12/19 0908  BP: 102/62  Pulse: 91  Weight: 123 lb 11.2 oz (56.1 kg)    Fetal Status: Fetal Heart Rate (bpm): 154 Fundal Height: 30 cm Movement: Present     General:  Alert, oriented and cooperative. Patient is in no acute distress.  Skin: Skin is warm and dry. No rash noted.   Cardiovascular: Normal heart rate noted  Respiratory: Normal respiratory effort, no problems with respiration noted  Abdomen: Soft, gravid, appropriate for gestational age.  Pain/Pressure: Present     Pelvic: Cervical exam deferred        Extremities: Normal range of motion.  Edema: None  Mental Status: Normal mood and affect. Normal behavior. Normal judgment and thought content.   Assessment and Plan:  Pregnancy: [id number] 329w4d. Gestational diabetes mellitus (GDM) affecting pregnancy Has not had education or teaching, but had it prior, diet reviewed, supplies given - Blood Glucose Monitoring Suppl (ACCU-CHEK GUIDE ME) w/Device KIT; 1 Units by Does not apply route as needed.  Dispense: 1 kit; Refill: 0 - Accu-Chek FastClix Lancets MISC; 1  Device by Percutaneous route 4 (four) times daily.  Dispense: 100 each; Refill: 12 - glucose blood (ACCU-CHEK GUIDE) test strip; Use as instructed  Dispense: 100 each; Refill: 12  2. Supervision of high risk pregnancy, antepartum Continue prenatal care.   3. Multigravida of advanced maternal age in third trimester Declined all genetics  Preterm labor symptoms and general obstetric precautions including but not limited to vaginal bleeding, contractions, leaking of fluid and fetal movement were reviewed in detail with the patient. Please refer to After Visit Summary for other counseling recommendations.   Return in 2 weeks (on 03/26/2019) for in person, HRC--needs appt with D & N mgmt ASAP.  Future Appointments  Date Time Provider De[place] Matilda12/10/2018 10:35 AM ErChancy MilroyMD WOC-WOCA WOC  04/02/2019  9:15 AM [facility]12/02/2019 10:15 AM WH-MFC [place] WH-MFCUS MFC-US  04/03/2019 10:20 AM WH-MFC NURSE WH-MFC MFC-US    TaDonnamae JudeMD

## 2019-03-30 ENCOUNTER — Ambulatory Visit (INDEPENDENT_AMBULATORY_CARE_PROVIDER_SITE_OTHER): Payer: Medicaid Other | Admitting: Obstetrics and Gynecology

## 2019-03-30 ENCOUNTER — Encounter: Payer: Self-pay | Admitting: Obstetrics and Gynecology

## 2019-03-30 ENCOUNTER — Other Ambulatory Visit: Payer: Self-pay

## 2019-03-30 VITALS — BP 111/73 | HR 102 | Wt 127.4 lb

## 2019-03-30 DIAGNOSIS — Z23 Encounter for immunization: Secondary | ICD-10-CM | POA: Diagnosis not present

## 2019-03-30 DIAGNOSIS — Z113 Encounter for screening for infections with a predominantly sexual mode of transmission: Secondary | ICD-10-CM

## 2019-03-30 DIAGNOSIS — O0993 Supervision of high risk pregnancy, unspecified, third trimester: Secondary | ICD-10-CM | POA: Diagnosis not present

## 2019-03-30 DIAGNOSIS — O0990 Supervision of high risk pregnancy, unspecified, unspecified trimester: Secondary | ICD-10-CM

## 2019-03-30 DIAGNOSIS — O09523 Supervision of elderly multigravida, third trimester: Secondary | ICD-10-CM

## 2019-03-30 DIAGNOSIS — Z3A36 36 weeks gestation of pregnancy: Secondary | ICD-10-CM | POA: Diagnosis not present

## 2019-03-30 DIAGNOSIS — O24419 Gestational diabetes mellitus in pregnancy, unspecified control: Secondary | ICD-10-CM

## 2019-03-30 MED ORDER — METFORMIN HCL 500 MG PO TABS: 500.0000 mg | ORAL_TABLET | Freq: Every day | ORAL | 5 refills | Status: DC

## 2019-03-30 NOTE — Progress Notes (Signed)
Subjective:  Lisa Fleming is a 33 y.o. [id number] at [redacted]w[redacted]d being seen today for ongoing prenatal care.  She is currently monitored for the following issues for this high-risk pregnancy and has Supervision of high risk pregnancy, antepartum; History of gestational diabetes in prior pregnancy, currently pregnant; AMA (advanced maternal age) multigravida 108+; Limited prenatal care, antepartum; and Gestational diabetes mellitus (GDM) affecting pregnancy on their problem list.  Patient reports no complaints.  Contractions: Irritability. Vag. Bleeding: None.  Movement: Present. Denies leaking of fluid.   The following portions of the patient's history were reviewed and updated as appropriate: allergies, current medications, past family history, past medical history, past social history, past surgical history and problem list. Problem list updated.  Objective:   Vitals:   03/30/19 1123  BP: 111/73  Pulse: (!) 102  Weight: 127 lb 6.4 oz (57.8 kg)    Fetal Status: Fetal Heart Rate (bpm): 138   Movement: Present     General:  Alert, oriented and cooperative. Patient is in no acute distress.  Skin: Skin is warm and dry. No rash noted.   Cardiovascular: Normal heart rate noted  Respiratory: Normal respiratory effort, no problems with respiration noted  Abdomen: Soft, gravid, appropriate for gestational age. Pain/Pressure: Present     Pelvic:  Cervical exam deferred        Extremities: Normal range of motion.  Edema: None  Mental Status: Normal mood and affect. Normal behavior. Normal judgment and thought content.   Urinalysis:      Assessment and Plan:  Pregnancy: [id number] at [redacted]w[redacted]d  1. Supervision of high risk pregnancy, antepartum Stable Vaginal cultures obtained - Tdap vaccine greater than or equal to 7yo IM - GC/Chlamydia probe amp ()not at [facility] - Culture, beta strep (group b only)  2. Gestational diabetes mellitus (GDM) affecting pregnancy CBG's not in goal range yet, mostly  PP Will start Metformin with breakfast Reviewed with pt Has appt with DM educator this week Growth scan this week will add BPP d/t staring meds   - metFORMIN (GLUCOPHAGE) 500 MG tablet; Take 1 tablet (500 mg total) by mouth daily with breakfast.  Dispense: 60 tablet; Refill: 5  3. Multigravida of advanced maternal age in third trimester Declined genetic studies  Term labor symptoms and general obstetric precautions including but not limited to vaginal bleeding, contractions, leaking of fluid and fetal movement were reviewed in detail with the patient. Please refer to After Visit Summary for other counseling recommendations.  Return in about 1 week (around 04/06/2019) for OB visit, face to face with female MD provider.   Chancy Milroy, MD

## 2019-03-30 NOTE — Progress Notes (Signed)
Pt has Glucose Paper logs./ pt states some time has a light green tint of d/c without odor or irritation.

## 2019-03-30 NOTE — Patient Instructions (Signed)
Third Trimester of Pregnancy The third trimester is from week 28 through week 40 (months 7 through 9). The third trimester is a time when the unborn baby (fetus) is growing rapidly. At the end of the ninth month, the fetus is about 20 inches in length and weighs 6-10 pounds. Body changes during your third trimester Your body will continue to go through many changes during pregnancy. The changes vary from woman to woman. During the third trimester:  Your weight will continue to increase. You can expect to gain 25-35 pounds (11-16 kg) by the end of the pregnancy.  You may begin to get stretch marks on your hips, abdomen, and breasts.  You may urinate more often because the fetus is moving lower into your pelvis and pressing on your bladder.  You may develop or continue to have heartburn. This is caused by increased hormones that slow down muscles in the digestive tract.  You may develop or continue to have constipation because increased hormones slow digestion and cause the muscles that push waste through your intestines to relax.  You may develop hemorrhoids. These are swollen veins (varicose veins) in the rectum that can itch or be painful.  You may develop swollen, bulging veins (varicose veins) in your legs.  You may have increased body aches in the pelvis, back, or thighs. This is due to weight gain and increased hormones that are relaxing your joints.  You may have changes in your hair. These can include thickening of your hair, rapid growth, and changes in texture. Some women also have hair loss during or after pregnancy, or hair that feels dry or thin. Your hair will most likely return to normal after your baby is born.  Your breasts will continue to grow and they will continue to become tender. A yellow fluid (colostrum) may leak from your breasts. This is the first milk you are producing for your baby.  Your belly button may stick out.  You may notice more swelling in your hands,  face, or ankles.  You may have increased tingling or numbness in your hands, arms, and legs. The skin on your belly may also feel numb.  You may feel short of breath because of your expanding uterus.  You may have more problems sleeping. This can be caused by the size of your belly, increased need to urinate, and an increase in your body's metabolism.  You may notice the fetus "dropping," or moving lower in your abdomen (lightening).  You may have increased vaginal discharge.  You may notice your joints feel loose and you may have pain around your pelvic bone. What to expect at prenatal visits You will have prenatal exams every 2 weeks until week 36. Then you will have weekly prenatal exams. During a routine prenatal visit:  You will be weighed to make sure you and the baby are growing normally.  Your blood pressure will be taken.  Your abdomen will be measured to track your baby's growth.  The fetal heartbeat will be listened to.  Any test results from the previous visit will be discussed.  You may have a cervical check near your due date to see if your cervix has softened or thinned (effaced).  You will be tested for Group B streptococcus. This happens between 35 and 37 weeks. Your health care provider may ask you:  What your birth plan is.  How you are feeling.  If you are feeling the baby move.  If you have had any abnormal   symptoms, such as leaking fluid, bleeding, severe headaches, or abdominal cramping.  If you are using any tobacco products, including cigarettes, chewing tobacco, and electronic cigarettes.  If you have any questions. Other tests or screenings that may be performed during your third trimester include:  Blood tests that check for low iron levels (anemia).  Fetal testing to check the health, activity level, and growth of the fetus. Testing is done if you have certain medical conditions or if there are problems during the pregnancy.  Nonstress test  (NST). This test checks the health of your baby to make sure there are no signs of problems, such as the baby not getting enough oxygen. During this test, a belt is placed around your belly. The baby is made to move, and its heart rate is monitored during movement. What is false labor? False labor is a condition in which you feel small, irregular tightenings of the muscles in the womb (contractions) that usually go away with rest, changing position, or drinking water. These are called Braxton Hicks contractions. Contractions may last for hours, days, or even weeks before true labor sets in. If contractions come at regular intervals, become more frequent, increase in intensity, or become painful, you should see your health care provider. What are the signs of labor?  Abdominal cramps.  Regular contractions that start at 10 minutes apart and become stronger and more frequent with time.  Contractions that start on the top of the uterus and spread down to the lower abdomen and back.  Increased pelvic pressure and dull back pain.  A watery or bloody mucus discharge that comes from the vagina.  Leaking of amniotic fluid. This is also known as your "water breaking." It could be a slow trickle or a gush. Let your health care provider know if it has a color or strange odor. If you have any of these signs, call your health care provider right away, even if it is before your due date. Follow these instructions at home: Medicines  Follow your health care provider's instructions regarding medicine use. Specific medicines may be either safe or unsafe to take during pregnancy.  Take a prenatal vitamin that contains at least 600 micrograms (mcg) of folic acid.  If you develop constipation, try taking a stool softener if your health care provider approves. Eating and drinking   Eat a balanced diet that includes fresh fruits and vegetables, whole grains, good sources of protein such as meat, eggs, or tofu,  and low-fat dairy. Your health care provider will help you determine the amount of weight gain that is right for you.  Avoid raw meat and uncooked cheese. These carry germs that can cause birth defects in the baby.  If you have low calcium intake from food, talk to your health care provider about whether you should take a daily calcium supplement.  Eat four or five small meals rather than three large meals a day.  Limit foods that are high in fat and processed sugars, such as fried and sweet foods.  To prevent constipation: ? Drink enough fluid to keep your urine clear or pale yellow. ? Eat foods that are high in fiber, such as fresh fruits and vegetables, whole grains, and beans. Activity  Exercise only as directed by your health care provider. Most women can continue their usual exercise routine during pregnancy. Try to exercise for 30 minutes at least 5 days a week. Stop exercising if you experience uterine contractions.  Avoid heavy lifting.  Do   not exercise in extreme heat or humidity, or at high altitudes.  Wear low-heel, comfortable shoes.  Practice good posture.  You may continue to have sex unless your health care provider tells you otherwise. Relieving pain and discomfort  Take frequent breaks and rest with your legs elevated if you have leg cramps or low back pain.  Take warm sitz baths to soothe any pain or discomfort caused by hemorrhoids. Use hemorrhoid cream if your health care provider approves.  Wear a good support bra to prevent discomfort from breast tenderness.  If you develop varicose veins: ? Wear support pantyhose or compression stockings as told by your healthcare provider. ? Elevate your feet for 15 minutes, 3-4 times a day. Prenatal care  Write down your questions. Take them to your prenatal visits.  Keep all your prenatal visits as told by your health care provider. This is important. Safety  Wear your seat belt at all times when driving.  Make  a list of emergency phone numbers, including numbers for family, friends, the hospital, and police and fire departments. General instructions  Avoid cat litter boxes and soil used by cats. These carry germs that can cause birth defects in the baby. If you have a cat, ask someone to clean the litter box for you.  Do not travel far distances unless it is absolutely necessary and only with the approval of your health care provider.  Do not use hot tubs, steam rooms, or saunas.  Do not drink alcohol.  Do not use any products that contain nicotine or tobacco, such as cigarettes and e-cigarettes. If you need help quitting, ask your health care provider.  Do not use any medicinal herbs or unprescribed drugs. These chemicals affect the formation and growth of the baby.  Do not douche or use tampons or scented sanitary pads.  Do not cross your legs for long periods of time.  To prepare for the arrival of your baby: ? Take prenatal classes to understand, practice, and ask questions about labor and delivery. ? Make a trial run to the hospital. ? Visit the hospital and tour the maternity area. ? Arrange for maternity or paternity leave through employers. ? Arrange for family and friends to take care of pets while you are in the hospital. ? Purchase a rear-facing car seat and make sure you know how to install it in your car. ? Pack your hospital bag. ? Prepare the baby's nursery. Make sure to remove all pillows and stuffed animals from the baby's crib to prevent suffocation.  Visit your dentist if you have not gone during your pregnancy. Use a soft toothbrush to brush your teeth and be gentle when you floss. Contact a health care provider if:  You are unsure if you are in labor or if your water has broken.  You become dizzy.  You have mild pelvic cramps, pelvic pressure, or nagging pain in your abdominal area.  You have lower back pain.  You have persistent nausea, vomiting, or diarrhea.   You have an unusual or bad smelling vaginal discharge.  You have pain when you urinate. Get help right away if:  Your water breaks before 37 weeks.  You have regular contractions less than 5 minutes apart before 37 weeks.  You have a fever.  You are leaking fluid from your vagina.  You have spotting or bleeding from your vagina.  You have severe abdominal pain or cramping.  You have rapid weight loss or weight gain.  You have   shortness of breath with chest pain.  You notice sudden or extreme swelling of your face, hands, ankles, feet, or legs.  Your baby makes fewer than 10 movements in 2 hours.  You have severe headaches that do not go away when you take medicine.  You have vision changes. Summary  The third trimester is from week 28 through week 40, months 7 through 9. The third trimester is a time when the unborn baby (fetus) is growing rapidly.  During the third trimester, your discomfort may increase as you and your baby continue to gain weight. You may have abdominal, leg, and back pain, sleeping problems, and an increased need to urinate.  During the third trimester your breasts will keep growing and they will continue to become tender. A yellow fluid (colostrum) may leak from your breasts. This is the first milk you are producing for your baby.  False labor is a condition in which you feel small, irregular tightenings of the muscles in the womb (contractions) that eventually go away. These are called Braxton Hicks contractions. Contractions may last for hours, days, or even weeks before true labor sets in.  Signs of labor can include: abdominal cramps; regular contractions that start at 10 minutes apart and become stronger and more frequent with time; watery or bloody mucus discharge that comes from the vagina; increased pelvic pressure and dull back pain; and leaking of amniotic fluid. This information is not intended to replace advice given to you by your health  care provider. Make sure you discuss any questions you have with your health care provider. Document Released: 04/03/2001 Document Revised: 07/31/2018 Document Reviewed: 05/15/2016 Elsevier Patient Education  2020 [employer]  

## 2019-04-01 LAB — GC/CHLAMYDIA PROBE AMP (~~LOC~~) NOT AT ARMC
Chlamydia: NEGATIVE
Comment: NEGATIVE
Comment: NORMAL
Neisseria Gonorrhea: NEGATIVE

## 2019-04-02 ENCOUNTER — Other Ambulatory Visit: Payer: Self-pay

## 2019-04-02 ENCOUNTER — Encounter: Payer: Medicaid Other | Attending: Family Medicine | Admitting: *Deleted

## 2019-04-02 ENCOUNTER — Ambulatory Visit: Payer: No Typology Code available for payment source | Admitting: *Deleted

## 2019-04-02 ENCOUNTER — Other Ambulatory Visit: Payer: Self-pay | Admitting: Lactation Services

## 2019-04-02 DIAGNOSIS — O24419 Gestational diabetes mellitus in pregnancy, unspecified control: Secondary | ICD-10-CM | POA: Diagnosis not present

## 2019-04-02 DIAGNOSIS — Z713 Dietary counseling and surveillance: Secondary | ICD-10-CM | POA: Insufficient documentation

## 2019-04-02 LAB — CULTURE, BETA STREP (GROUP B ONLY): Strep Gp B Culture: POSITIVE — AB

## 2019-04-02 MED ORDER — FREESTYLE SYSTEM KIT: 1.0000 | PACK | 0 refills | Status: AC | PRN

## 2019-04-02 MED ORDER — GLUCOSE BLOOD VI STRP: ORAL_STRIP | 12 refills | Status: AC

## 2019-04-02 MED ORDER — FREESTYLE LANCETS MISC: 12 refills | Status: AC

## 2019-04-02 NOTE — Progress Notes (Signed)
  Patient was seen on 04/02/2019 for Gestational Diabetes self-management. EDD 04/26/2019. Patient speaks Arabic and we had live [facility] here for this visit. Patient states history of GDM with last pregnancy about 7 years ago. She states she tested her blood sugars then too.  Diet history obtained. Patient eats very good variety of all food groups. Beverages include water, coffee and occasionally a diet soda. The following learning objectives were met by the patient :   States the definition of Gestational Diabetes  States why dietary management is important in controlling blood glucose  Describes the effects of carbohydrates on blood glucose levels  Demonstrates ability to create a balanced meal plan  Demonstrates carbohydrate counting   States when to check blood glucose levels  Demonstrates proper blood glucose monitoring techniques  States the effect of stress and exercise on blood glucose levels  States the importance of limiting caffeine and abstaining from alcohol and smoking  Plan:  Aim for 3 Carb Choices per meal (45 grams) +/- 1 either way  Aim for 1-2 Carbs per snack If OK with your MD, consider  increasing your activity level by walking, Arm Chair Exercises or other activity daily as tolerated Continue checking BG before breakfast and 2 hours after first bite of breakfast, lunch and dinner as directed by MD  Bring Log Book/Sheet and meter to every medical appointment  Baby Scripts:  Patient not appropriate for Baby Scripts due to language barrier  Take medication if directed by MD  Patient already has a meter: Accu Chek Guide And is testing pre breakfast and 2 hours each meal as directed by MD Review of Log Book shows: FBG: 14/19 are below 95 mg/dl Post meal BG, about half are above 120 at all meals with highest BG @ 147. She states the higher numbers are when she eats something sweet. On 03/30/2019 Dr.Ervin Rx'd Metformin, she plans to start that  today.  She has [employer] and it covers the [employer] and supplies. Plan to change Rx to this meter and supplies today. Blood glucose monitor Rx called into pharmacy: Abbott Freestyle meter and supplies  Patient instructed to continue to test pre breakfast and 2 hours each meal as directed by MD  Patient instructed to monitor glucose levels: FBS: 60 - 95 mg/dl 2 hour: <120 mg/dl  Patient received the following handouts: in Arabic  Nutrition Diabetes and Pregnancy  Carbohydrate Counting List  BG Log Sheet  Patient will be seen for follow-up as needed.

## 2019-04-03 ENCOUNTER — Ambulatory Visit (HOSPITAL_COMMUNITY): Payer: No Typology Code available for payment source | Admitting: *Deleted

## 2019-04-03 ENCOUNTER — Encounter (HOSPITAL_COMMUNITY): Payer: Self-pay

## 2019-04-03 ENCOUNTER — Ambulatory Visit (HOSPITAL_COMMUNITY)
Admission: RE | Admit: 2019-04-03 | Discharge: 2019-04-03 | Disposition: A | Discharge status: Home / Self Care | Payer: No Typology Code available for payment source | Source: Ambulatory Visit | Attending: Obstetrics and Gynecology | Admitting: Obstetrics and Gynecology

## 2019-04-03 ENCOUNTER — Other Ambulatory Visit (HOSPITAL_COMMUNITY): Payer: Self-pay | Admitting: Obstetrics and Gynecology

## 2019-04-03 DIAGNOSIS — O24415 Gestational diabetes mellitus in pregnancy, controlled by oral hypoglycemic drugs: Secondary | ICD-10-CM | POA: Diagnosis not present

## 2019-04-03 DIAGNOSIS — O0990 Supervision of high risk pregnancy, unspecified, unspecified trimester: Secondary | ICD-10-CM | POA: Diagnosis present

## 2019-04-03 DIAGNOSIS — O0933 Supervision of pregnancy with insufficient antenatal care, third trimester: Secondary | ICD-10-CM

## 2019-04-03 DIAGNOSIS — O09299 Supervision of pregnancy with other poor reproductive or obstetric history, unspecified trimester: Secondary | ICD-10-CM | POA: Diagnosis present

## 2019-04-03 DIAGNOSIS — Z8632 Personal history of gestational diabetes: Secondary | ICD-10-CM

## 2019-04-03 DIAGNOSIS — Z362 Encounter for other antenatal screening follow-up: Secondary | ICD-10-CM | POA: Diagnosis not present

## 2019-04-03 DIAGNOSIS — O09523 Supervision of elderly multigravida, third trimester: Secondary | ICD-10-CM | POA: Diagnosis not present

## 2019-04-03 DIAGNOSIS — O0930 Supervision of pregnancy with insufficient antenatal care, unspecified trimester: Secondary | ICD-10-CM | POA: Insufficient documentation

## 2019-04-03 DIAGNOSIS — O24419 Gestational diabetes mellitus in pregnancy, unspecified control: Secondary | ICD-10-CM

## 2019-04-03 DIAGNOSIS — Z3A36 36 weeks gestation of pregnancy: Secondary | ICD-10-CM

## 2019-04-03 DIAGNOSIS — O09293 Supervision of pregnancy with other poor reproductive or obstetric history, third trimester: Secondary | ICD-10-CM

## 2019-04-06 ENCOUNTER — Encounter: Payer: Self-pay | Admitting: Obstetrics and Gynecology

## 2019-04-06 DIAGNOSIS — O99820 Streptococcus B carrier state complicating pregnancy: Secondary | ICD-10-CM | POA: Insufficient documentation

## 2019-04-07 ENCOUNTER — Encounter: Payer: No Typology Code available for payment source | Admitting: Family Medicine

## 2019-04-08 ENCOUNTER — Other Ambulatory Visit: Payer: No Typology Code available for payment source

## 2019-04-09 ENCOUNTER — Ambulatory Visit (INDEPENDENT_AMBULATORY_CARE_PROVIDER_SITE_OTHER): Payer: Medicaid Other | Admitting: *Deleted

## 2019-04-09 ENCOUNTER — Ambulatory Visit: Payer: Self-pay

## 2019-04-09 ENCOUNTER — Other Ambulatory Visit (HOSPITAL_COMMUNITY)
Admission: RE | Admit: 2019-04-09 | Discharge: 2019-04-09 | Disposition: A | Discharge status: Home / Self Care | Payer: Medicaid Other | Source: Ambulatory Visit | Attending: Family Medicine | Admitting: Family Medicine

## 2019-04-09 ENCOUNTER — Ambulatory Visit (INDEPENDENT_AMBULATORY_CARE_PROVIDER_SITE_OTHER): Payer: No Typology Code available for payment source | Admitting: Obstetrics and Gynecology

## 2019-04-09 ENCOUNTER — Other Ambulatory Visit: Payer: Self-pay

## 2019-04-09 ENCOUNTER — Encounter: Payer: Self-pay | Admitting: General Practice

## 2019-04-09 ENCOUNTER — Encounter: Payer: Self-pay | Admitting: Obstetrics and Gynecology

## 2019-04-09 VITALS — BP 121/73 | HR 97 | Wt 127.2 lb

## 2019-04-09 DIAGNOSIS — Z3A37 37 weeks gestation of pregnancy: Secondary | ICD-10-CM | POA: Diagnosis not present

## 2019-04-09 DIAGNOSIS — O24415 Gestational diabetes mellitus in pregnancy, controlled by oral hypoglycemic drugs: Secondary | ICD-10-CM | POA: Diagnosis not present

## 2019-04-09 DIAGNOSIS — N898 Other specified noninflammatory disorders of vagina: Secondary | ICD-10-CM

## 2019-04-09 DIAGNOSIS — O0993 Supervision of high risk pregnancy, unspecified, third trimester: Secondary | ICD-10-CM

## 2019-04-09 DIAGNOSIS — O09523 Supervision of elderly multigravida, third trimester: Secondary | ICD-10-CM

## 2019-04-09 DIAGNOSIS — O24419 Gestational diabetes mellitus in pregnancy, unspecified control: Secondary | ICD-10-CM

## 2019-04-09 DIAGNOSIS — O99820 Streptococcus B carrier state complicating pregnancy: Secondary | ICD-10-CM

## 2019-04-09 DIAGNOSIS — Z789 Other specified health status: Secondary | ICD-10-CM

## 2019-04-09 DIAGNOSIS — O0990 Supervision of high risk pregnancy, unspecified, unspecified trimester: Secondary | ICD-10-CM

## 2019-04-09 MED ORDER — METFORMIN HCL 500 MG PO TABS: 500.0000 mg | ORAL_TABLET | Freq: Two times a day (BID) | ORAL | 5 refills | Status: DC

## 2019-04-09 NOTE — Progress Notes (Signed)
   PRENATAL VISIT NOTE  Subjective:  Lisa Fleming is a 33 y.o. X1G6269 at [redacted]w[redacted]d being seen today for ongoing prenatal care.  She is currently monitored for the following issues for this high-risk pregnancy and has Supervision of high risk pregnancy, antepartum; History of gestational diabetes in prior pregnancy, currently pregnant; AMA (advanced maternal age) multigravida 73+; Limited prenatal care, antepartum; Gestational diabetes mellitus (GDM) affecting pregnancy; GBS (group B Streptococcus carrier), +RV culture, currently pregnant; and Language barrier on their problem list.  Patient reports light green vaginal discharge. Occasional itching. Complains of pain in RUQ, states she feels like baby is right there.  Contractions: Irritability. Vag. Bleeding: None.  Movement: Present. Denies leaking of fluid.   The following portions of the patient's history were reviewed and updated as appropriate: allergies, current medications, past family history, past medical history, past social history, past surgical history and problem list.   Objective:   Vitals:   04/09/19 0825  BP: 121/73  Pulse: 97  Weight: 127 lb 3.2 oz (57.7 kg)    Fetal Status: Fetal Heart Rate (bpm): 134   Movement: Present     General:  Alert, oriented and cooperative. Patient is in no acute distress.  Skin: Skin is warm and dry. No rash noted.   Cardiovascular: Normal heart rate noted  Respiratory: Normal respiratory effort, no problems with respiration noted  Abdomen: Soft, gravid, appropriate for gestational age.  Pain/Pressure: Present     Pelvic: Cervical exam deferred        Extremities: Normal range of motion.  Edema: None  Mental Status: Normal mood and affect. Normal behavior. Normal judgment and thought content.   Assessment and Plan:  Pregnancy: [id number] at [redacted]w[redacted]d  1. Supervision of high risk pregnancy, antepartum To hospital if has severe RUQ pain, none on exam today  2. Multigravida of advanced maternal  age in third trimester  3. Gestational diabetes mellitus (GDM) affecting pregnancy Metformin 500 mg IOL scheduled today for 39 weeks Orders for admission placed FG: 84-97 PP: 120-130 Start Metformin 500 mg BID Cont weekly testing Reviewed induction process, COVID testing.   4. GBS (group B Streptococcus carrier), +RV culture, currently pregnant ppx in labor  5. Language barrier Fish farm manager used  Preterm labor symptoms and general obstetric precautions including but not limited to vaginal bleeding, contractions, leaking of fluid and fetal movement were reviewed in detail with the patient. Please refer to After Visit Summary for other counseling recommendations.   Return in about 1 week (around 04/16/2019) for high OB, in person.  Future Appointments  Date Time Provider [place]  04/09/2019 10:15 AM WOC-WOCA NST WOC-WOCA WOC  04/19/2019  8:40 AM MC-LD SCHED ROOM MC-INDC None    Sloan Leiter, MD

## 2019-04-09 NOTE — Addendum Note (Signed)
Addended by: Louisa Second E on: 04/09/2019 11:34 AM   Modules accepted: Orders

## 2019-04-09 NOTE — Progress Notes (Deleted)
IOL scheduled for 04/19/19 AM and pt notified by provider.   Apolonio Schneiders RN 04/09/19

## 2019-04-09 NOTE — Progress Notes (Addendum)
Pt reports thick, green vaginal discharge with itching. Denies foul odor.   IOL scheduled for 04/19/19 AM and pt notified by provider.   Apolonio Schneiders RN 04/09/19

## 2019-04-10 ENCOUNTER — Telehealth (HOSPITAL_COMMUNITY): Payer: Self-pay | Admitting: *Deleted

## 2019-04-10 LAB — CERVICOVAGINAL ANCILLARY ONLY
Bacterial Vaginitis (gardnerella): NEGATIVE
Candida Glabrata: NEGATIVE
Candida Vaginitis: POSITIVE — AB
Chlamydia: NEGATIVE
Comment: NEGATIVE
Comment: NEGATIVE
Comment: NEGATIVE
Comment: NEGATIVE
Comment: NEGATIVE
Comment: NORMAL
Neisseria Gonorrhea: NEGATIVE
Trichomonas: NEGATIVE

## 2019-04-10 MED ORDER — CLOTRIMAZOLE 1 % VA CREA: 1.0000 | TOPICAL_CREAM | Freq: Every day | VAGINAL | 2 refills | Status: AC

## 2019-04-10 NOTE — Telephone Encounter (Signed)
Preadmission screen Interpreter number [phone]

## 2019-04-10 NOTE — Addendum Note (Signed)
Addended by: Vivien Rota on: 04/10/2019 03:39 PM   Modules accepted: Orders

## 2019-04-13 ENCOUNTER — Encounter (HOSPITAL_COMMUNITY): Payer: Self-pay | Admitting: *Deleted

## 2019-04-13 ENCOUNTER — Telehealth (HOSPITAL_COMMUNITY): Payer: Self-pay | Admitting: *Deleted

## 2019-04-13 ENCOUNTER — Other Ambulatory Visit: Payer: Self-pay

## 2019-04-13 DIAGNOSIS — O0990 Supervision of high risk pregnancy, unspecified, unspecified trimester: Secondary | ICD-10-CM

## 2019-04-13 MED ORDER — PRENATAL PLUS 27-1 MG PO TABS: 1.0000 | ORAL_TABLET | Freq: Every day | ORAL | 2 refills | Status: AC

## 2019-04-13 NOTE — Telephone Encounter (Signed)
[phone] interpreter number

## 2019-04-13 NOTE — Telephone Encounter (Signed)
Preadmission screen Interpreter number [phone]

## 2019-04-14 ENCOUNTER — Ambulatory Visit (HOSPITAL_COMMUNITY)
Admission: RE | Admit: 2019-04-14 | Discharge: 2019-04-14 | Disposition: A | Discharge status: Home / Self Care | Payer: Medicaid Other | Source: Ambulatory Visit | Attending: Obstetrics and Gynecology | Admitting: Obstetrics and Gynecology

## 2019-04-14 ENCOUNTER — Ambulatory Visit (HOSPITAL_COMMUNITY): Payer: Medicaid Other

## 2019-04-14 ENCOUNTER — Other Ambulatory Visit: Payer: Self-pay | Admitting: *Deleted

## 2019-04-14 ENCOUNTER — Ambulatory Visit (HOSPITAL_COMMUNITY): Payer: Medicaid Other | Admitting: *Deleted

## 2019-04-14 ENCOUNTER — Encounter (HOSPITAL_COMMUNITY): Payer: Self-pay | Admitting: *Deleted

## 2019-04-14 ENCOUNTER — Other Ambulatory Visit: Payer: Self-pay

## 2019-04-14 DIAGNOSIS — O0933 Supervision of pregnancy with insufficient antenatal care, third trimester: Secondary | ICD-10-CM

## 2019-04-14 DIAGNOSIS — O0930 Supervision of pregnancy with insufficient antenatal care, unspecified trimester: Secondary | ICD-10-CM | POA: Insufficient documentation

## 2019-04-14 DIAGNOSIS — O0990 Supervision of high risk pregnancy, unspecified, unspecified trimester: Secondary | ICD-10-CM | POA: Diagnosis present

## 2019-04-14 DIAGNOSIS — O09299 Supervision of pregnancy with other poor reproductive or obstetric history, unspecified trimester: Secondary | ICD-10-CM

## 2019-04-14 DIAGNOSIS — O09293 Supervision of pregnancy with other poor reproductive or obstetric history, third trimester: Secondary | ICD-10-CM | POA: Diagnosis not present

## 2019-04-14 DIAGNOSIS — O09523 Supervision of elderly multigravida, third trimester: Secondary | ICD-10-CM | POA: Diagnosis not present

## 2019-04-14 DIAGNOSIS — O24419 Gestational diabetes mellitus in pregnancy, unspecified control: Secondary | ICD-10-CM | POA: Insufficient documentation

## 2019-04-14 DIAGNOSIS — Z3A38 38 weeks gestation of pregnancy: Secondary | ICD-10-CM

## 2019-04-14 DIAGNOSIS — O24415 Gestational diabetes mellitus in pregnancy, controlled by oral hypoglycemic drugs: Secondary | ICD-10-CM | POA: Diagnosis not present

## 2019-04-14 DIAGNOSIS — Z8632 Personal history of gestational diabetes: Secondary | ICD-10-CM | POA: Diagnosis present

## 2019-04-14 IMAGING — US US MFM FETAL BPP W/O NON-STRESS
1 series · 15 of 19 positions shown · non-contrast
Comparison: none

[Series 1: us mfm fetal bpp w/o non-stress · 19 acquisitions, 15 frames shown]
[im 1/19]
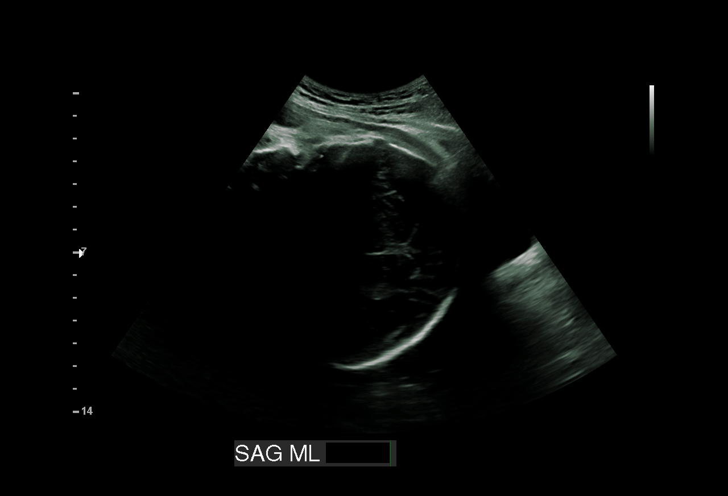
[im 2/19]
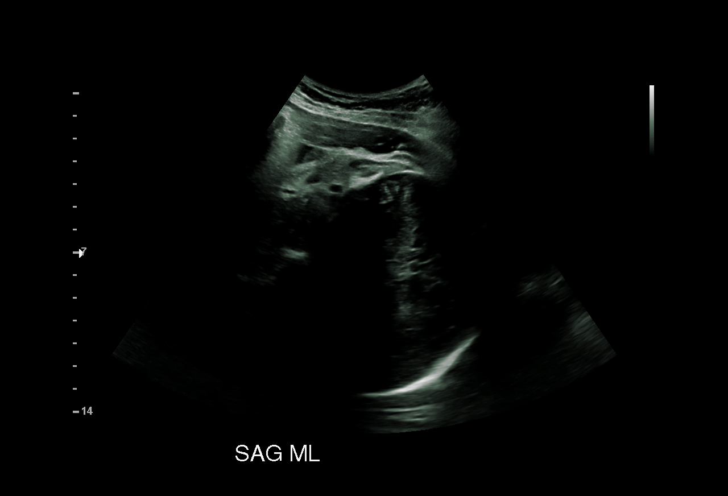
[im 4/19]
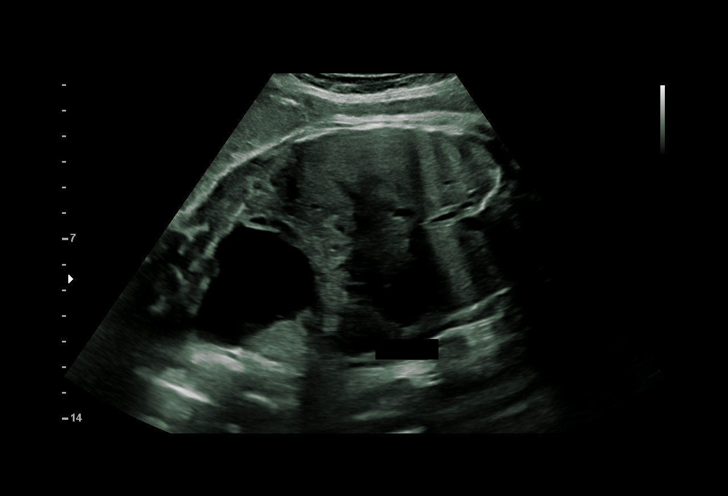
[im 5/19]
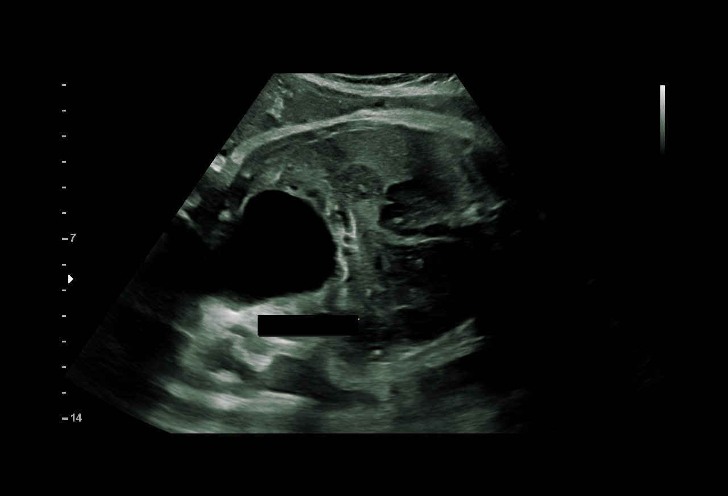
[im 6/19]
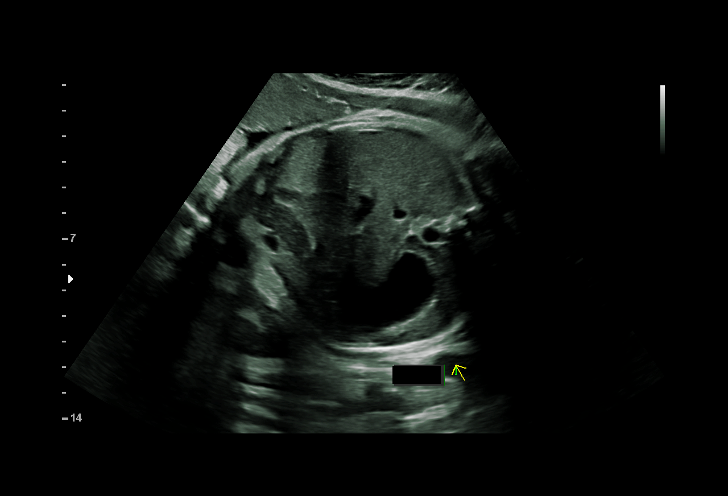
[im 7/19]
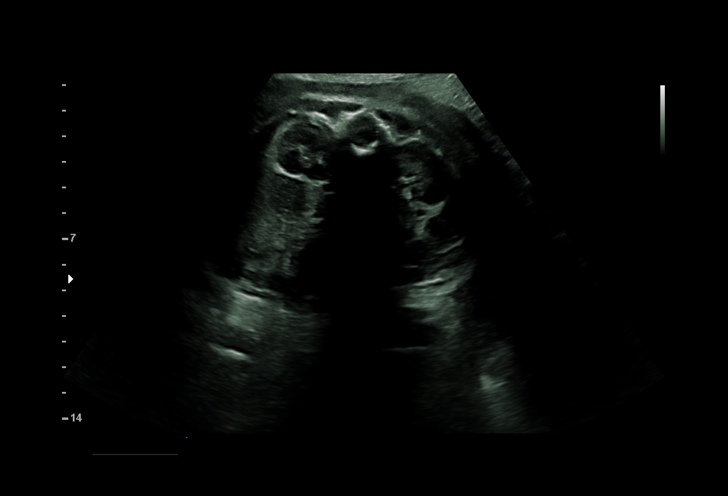
[im 9/19]
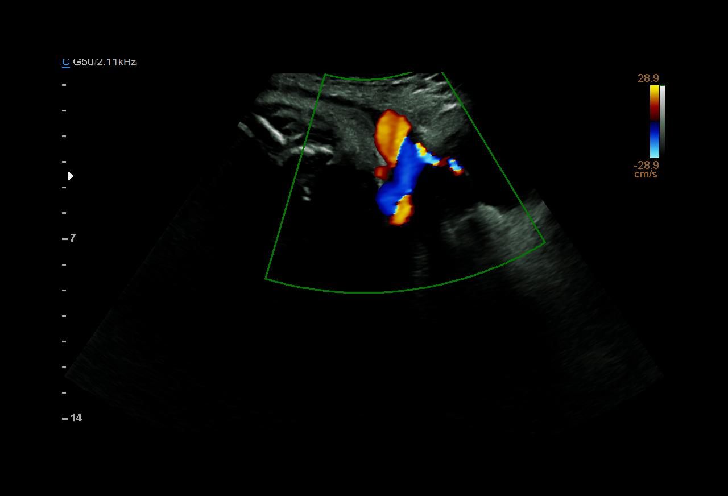
[im 10/19]
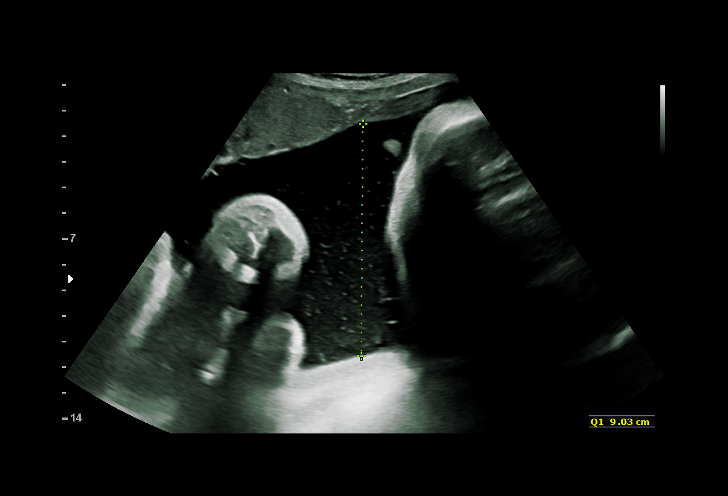
[im 11/19]
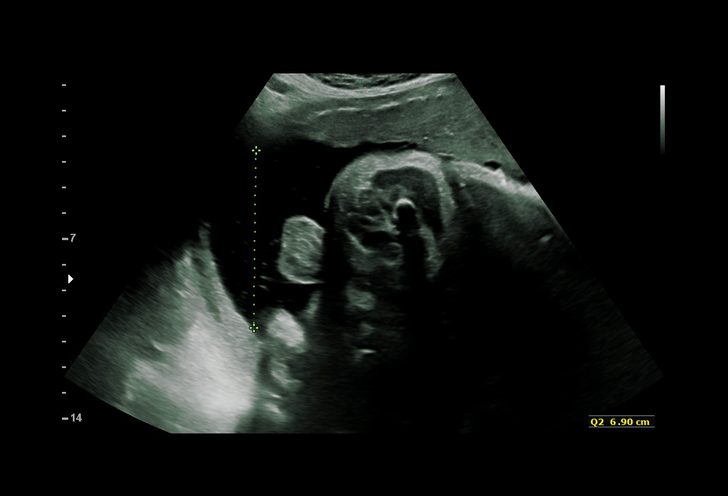
[im 13/19]
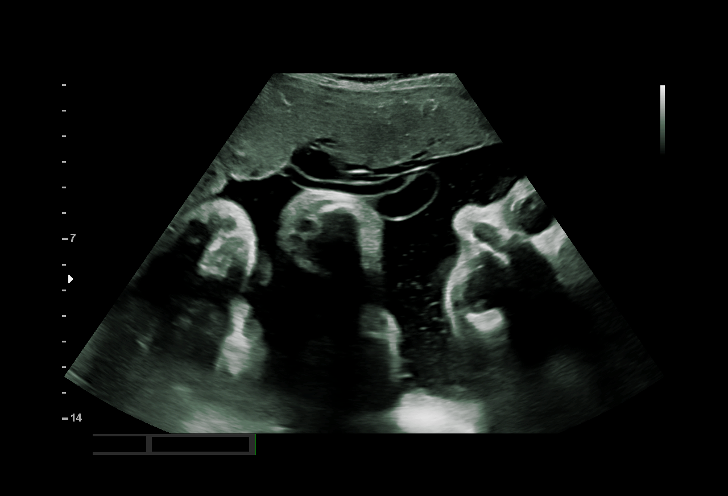
[im 14/19]
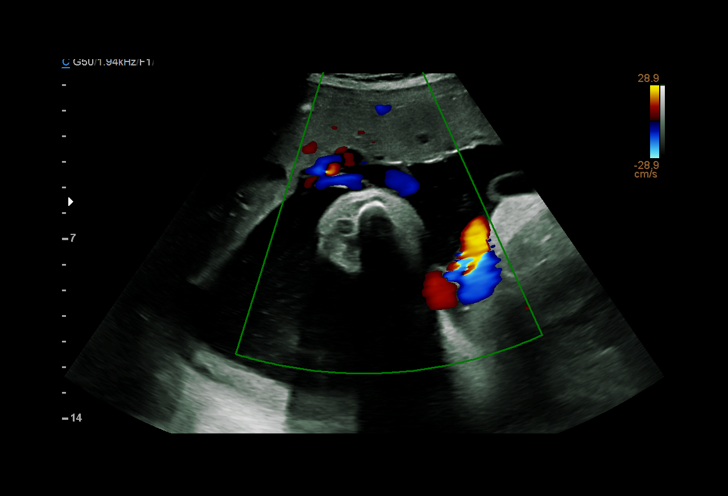
[im 15/19]
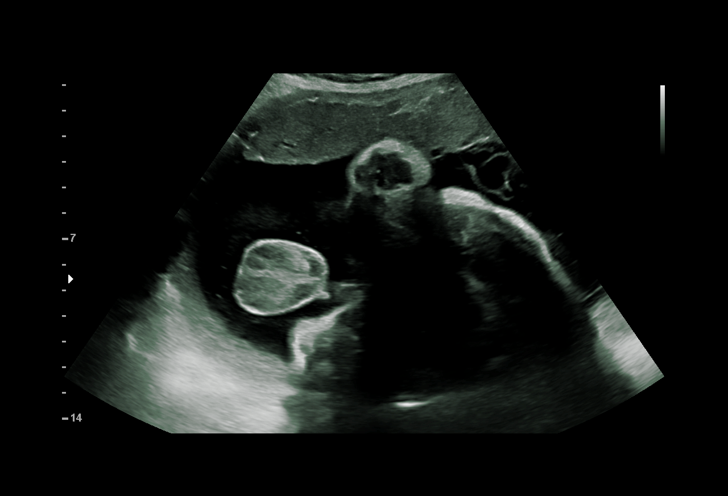
[im 16/19]
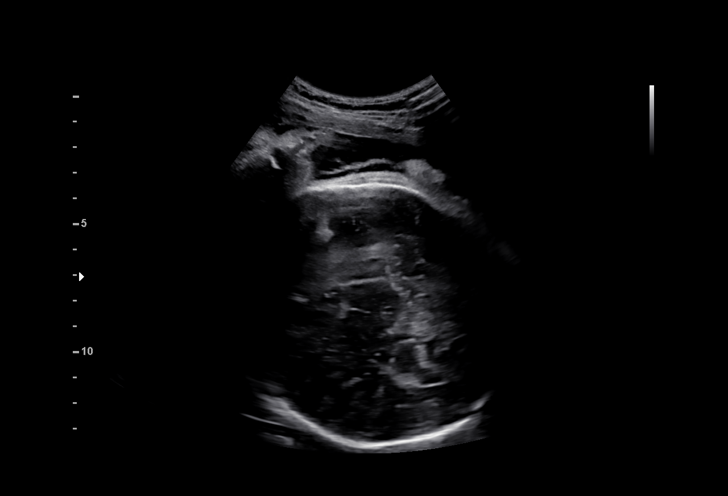
[im 18/19]
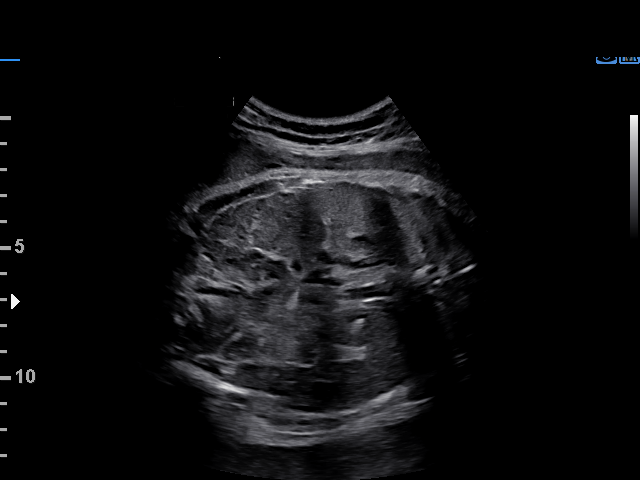
[im 19/19]
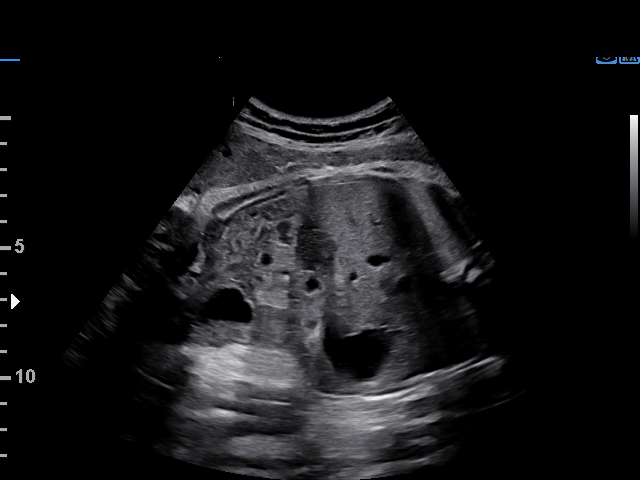

[15 of 19 positions shown; findings below may reference images not displayed]

[address]

 ----------------------------------------------------------------------

 ----------------------------------------------------------------------
Indications

  Gestational diabetes in pregnancy,             [01]
  controlled by oral hypoglycemic drugs
  38 weeks gestation of pregnancy
  Insufficient Prenatal Care                     [01]
  Advanced maternal age multigravida 35+,        [01]
  third trimester
  Poor obstetric history: Previous gestational   [01]
  diabetes
 ----------------------------------------------------------------------
Fetal Evaluation

 Num Of Fetuses:         1
 Fetal Heart Rate(bpm):  132
 Cardiac Activity:       Observed
 Presentation:           Cephalic
 P. Cord Insertion:      Visualized, central

 Amniotic Fluid
 AFI FV:      Within normal limits

 AFI Sum(cm)     %Tile
 21.3            85
Biophysical Evaluation

 Amniotic F.V:   Within normal limits       F. Tone:        Observed
 F. Movement:    Observed                   Score:          [DATE]
 F. Breathing:   Observed
OB History

 Gravidity:    5         Term:   3         SAB:   1
 Living:       3
Gestational Age

 Best:          38w 2d     Det. By:  Previous Ultrasound      EDD:   [DATE]
                                     ([DATE])
Anatomy

 Diaphragm:             Appears normal         Kidneys:                Appear normal
 Stomach:               Appears normal, left   Bladder:                Appears normal
                        sided
 Cord Vessels:          Appears normal (3
                        vessel cord)
Cervix Uterus Adnexa

 Cervix
 Not visualized (advanced GA >[01])
Comments

 A biophysical profile performed today due to A2 gestational
 diabetes currently controlled with Metformin was [DATE].
 There was normal amniotic fluid noted on today's ultrasound
 exam.
 Due to A2 gestational diabetes, she is already scheduled for
 delivery in 5 days.

## 2019-04-15 ENCOUNTER — Ambulatory Visit (INDEPENDENT_AMBULATORY_CARE_PROVIDER_SITE_OTHER): Payer: No Typology Code available for payment source | Admitting: Family Medicine

## 2019-04-15 VITALS — BP 109/74 | HR 96 | Wt 129.0 lb

## 2019-04-15 DIAGNOSIS — O0993 Supervision of high risk pregnancy, unspecified, third trimester: Secondary | ICD-10-CM

## 2019-04-15 DIAGNOSIS — O0990 Supervision of high risk pregnancy, unspecified, unspecified trimester: Secondary | ICD-10-CM

## 2019-04-15 DIAGNOSIS — Z3A38 38 weeks gestation of pregnancy: Secondary | ICD-10-CM

## 2019-04-15 DIAGNOSIS — O24419 Gestational diabetes mellitus in pregnancy, unspecified control: Secondary | ICD-10-CM

## 2019-04-15 DIAGNOSIS — O09523 Supervision of elderly multigravida, third trimester: Secondary | ICD-10-CM

## 2019-04-15 DIAGNOSIS — Z789 Other specified health status: Secondary | ICD-10-CM

## 2019-04-15 DIAGNOSIS — O99820 Streptococcus B carrier state complicating pregnancy: Secondary | ICD-10-CM

## 2019-04-15 NOTE — Progress Notes (Signed)
   PRENATAL VISIT NOTE  Subjective:  Lisa Fleming is a 33 y.o. [id number] at [redacted]w[redacted]d being seen today for ongoing prenatal care.  She is currently monitored for the following issues for this high-risk pregnancy and has Supervision of high risk pregnancy, antepartum; History of gestational diabetes in prior pregnancy, currently pregnant; AMA (advanced maternal age) multigravida 85+; Limited prenatal care, antepartum; Gestational diabetes mellitus (GDM) affecting pregnancy; GBS (group B Streptococcus carrier), +RV culture, currently pregnant; and Language barrier on their problem list.  Patient reports no complaints.  Contractions: Irritability. Vag. Bleeding: None.  Movement: Present. Denies leaking of fluid.   The following portions of the patient's history were reviewed and updated as appropriate: allergies, current medications, past family history, past medical history, past social history, past surgical history and problem list.   Objective:   Vitals:   04/15/19 1333  BP: 109/74  Pulse: 96  Weight: 129 lb (58.5 kg)    Fetal Status: Fetal Heart Rate (bpm): 145   Movement: Present     General:  Alert, oriented and cooperative. Patient is in no acute distress.  Skin: Skin is warm and dry. No rash noted.   Cardiovascular: Normal heart rate noted  Respiratory: Normal respiratory effort, no problems with respiration noted  Abdomen: Soft, gravid, appropriate for gestational age.  Pain/Pressure: Present     Pelvic: Cervical exam deferred        Extremities: Normal range of motion.  Edema: None  Mental Status: Normal mood and affect. Normal behavior. Normal judgment and thought content.   Assessment and Plan:  Pregnancy: [id number] at [redacted]w[redacted]d 1. Supervision of high risk pregnancy, antepartum For IOL on Sud[place] - wants female provider only  2. Gestational diabetes mellitus (GDM) affecting pregnancy No book today Notes sugars are 95 or below After meals are low 100s  3. Multigravida of advanced  maternal age in third trimester Declined testing  4. GBS (group B Streptococcus carrier), +RV culture, currently pregnant For treatment in labor  5. Language barrier Arabic interpreter: in person used   Term labor symptoms and general obstetric precautions including but not limited to vaginal bleeding, contractions, leaking of fluid and fetal movement were reviewed in detail with the patient. Please refer to After Visit Summary for other counseling recommendations.   Return in 6 weeks (on 05/27/2019).  Future Appointments  Date Time Provider [place]  04/16/2019  9:20 AM MC-SCREENING MC-SDSC None  04/19/2019  8:40 AM MC-LD SCHED ROOM MC-INDC None    Donnamae Jude, MD

## 2019-04-15 NOTE — Patient Instructions (Signed)

## 2019-04-16 ENCOUNTER — Other Ambulatory Visit (HOSPITAL_COMMUNITY)
Admission: RE | Admit: 2019-04-16 | Discharge: 2019-04-16 | Disposition: A | Discharge status: Home / Self Care | Payer: No Typology Code available for payment source | Source: Ambulatory Visit | Attending: Family Medicine | Admitting: Family Medicine

## 2019-04-16 ENCOUNTER — Other Ambulatory Visit (HOSPITAL_COMMUNITY): Payer: Self-pay | Admitting: Student

## 2019-04-16 DIAGNOSIS — Z01812 Encounter for preprocedural laboratory examination: Secondary | ICD-10-CM | POA: Insufficient documentation

## 2019-04-16 DIAGNOSIS — Z20828 Contact with and (suspected) exposure to other viral communicable diseases: Secondary | ICD-10-CM | POA: Insufficient documentation

## 2019-04-16 LAB — SARS CORONAVIRUS 2 (TAT 6-24 HRS): SARS Coronavirus 2: NEGATIVE

## 2019-04-19 ENCOUNTER — Encounter (HOSPITAL_COMMUNITY): Payer: Self-pay | Admitting: Obstetrics & Gynecology

## 2019-04-19 ENCOUNTER — Inpatient Hospital Stay (HOSPITAL_COMMUNITY)
Admission: AD | Admit: 2019-04-19 | Discharge: 2019-04-20 | DRG: 807 | Disposition: A | Discharge status: Home / Self Care | Payer: Medicaid Other | Attending: Family Medicine | Admitting: Family Medicine

## 2019-04-19 ENCOUNTER — Other Ambulatory Visit: Payer: Self-pay

## 2019-04-19 ENCOUNTER — Inpatient Hospital Stay (HOSPITAL_COMMUNITY): Payer: Medicaid Other

## 2019-04-19 DIAGNOSIS — O4292 Full-term premature rupture of membranes, unspecified as to length of time between rupture and onset of labor: Secondary | ICD-10-CM | POA: Diagnosis present

## 2019-04-19 DIAGNOSIS — Z20828 Contact with and (suspected) exposure to other viral communicable diseases: Secondary | ICD-10-CM | POA: Diagnosis present

## 2019-04-19 DIAGNOSIS — O24425 Gestational diabetes mellitus in childbirth, controlled by oral hypoglycemic drugs: Secondary | ICD-10-CM | POA: Diagnosis present

## 2019-04-19 DIAGNOSIS — Z3A39 39 weeks gestation of pregnancy: Secondary | ICD-10-CM | POA: Diagnosis not present

## 2019-04-19 DIAGNOSIS — O99824 Streptococcus B carrier state complicating childbirth: Secondary | ICD-10-CM | POA: Diagnosis present

## 2019-04-19 DIAGNOSIS — O26893 Other specified pregnancy related conditions, third trimester: Secondary | ICD-10-CM | POA: Diagnosis present

## 2019-04-19 DIAGNOSIS — Z8759 Personal history of other complications of pregnancy, childbirth and the puerperium: Secondary | ICD-10-CM

## 2019-04-19 LAB — CBC
HCT: 38.9 % (ref 36.0–46.0)
Hemoglobin: 13.5 g/dL (ref 12.0–15.0)
MCH: 31.1 pg (ref 26.0–34.0)
MCHC: 34.7 g/dL (ref 30.0–36.0)
MCV: 89.6 fL (ref 80.0–100.0)
Platelets: 134 10*3/uL — ABNORMAL LOW (ref 150–400)
RBC: 4.34 MIL/uL (ref 3.87–5.11)
RDW: 13.3 % (ref 11.5–15.5)
WBC: 7 10*3/uL (ref 4.0–10.5)
nRBC: 0 % (ref 0.0–0.2)

## 2019-04-19 LAB — GLUCOSE, CAPILLARY
Glucose-Capillary: 75 mg/dL (ref 70–99)
Glucose-Capillary: 79 mg/dL (ref 70–99)
Glucose-Capillary: 89 mg/dL (ref 70–99)

## 2019-04-19 LAB — TYPE AND SCREEN
ABO/RH(D): O POS
Antibody Screen: NEGATIVE

## 2019-04-19 LAB — RPR: RPR Ser Ql: NONREACTIVE

## 2019-04-19 LAB — POCT FERN TEST: POCT Fern Test: POSITIVE

## 2019-04-19 MED ORDER — ONDANSETRON HCL 4 MG PO TABS: 4.0000 mg | ORAL_TABLET | ORAL | Status: DC | PRN

## 2019-04-19 MED ORDER — PRENATAL MULTIVITAMIN CH
1.0000 | ORAL_TABLET | Freq: Every day | ORAL | Status: DC
  Administered 2019-04-20: 12:00:00 1 via ORAL
  Filled 2019-04-19: qty 1

## 2019-04-19 MED ORDER — MISOPROSTOL 50MCG HALF TABLET
50.0000 ug | ORAL_TABLET | ORAL | Status: DC | PRN
  Administered 2019-04-19: 50 ug via BUCCAL
  Filled 2019-04-19: qty 1

## 2019-04-19 MED ORDER — SODIUM CHLORIDE 0.9 % IV SOLN
5.0000 10*6.[IU] | Freq: Once | INTRAVENOUS | Status: AC
  Administered 2019-04-19: 02:00:00 5 10*6.[IU] via INTRAVENOUS
  Filled 2019-04-19: qty 5

## 2019-04-19 MED ORDER — SOD CITRATE-CITRIC ACID 500-334 MG/5ML PO SOLN: 30.0000 mL | ORAL | Status: DC | PRN

## 2019-04-19 MED ORDER — ACETAMINOPHEN 325 MG PO TABS
650.0000 mg | ORAL_TABLET | ORAL | Status: DC | PRN
  Administered 2019-04-19: 650 mg via ORAL
  Filled 2019-04-19: qty 2

## 2019-04-19 MED ORDER — TETANUS-DIPHTH-ACELL PERTUSSIS 5-2.5-18.5 LF-MCG/0.5 IM SUSP: 0.5000 mL | Freq: Once | INTRAMUSCULAR | Status: DC

## 2019-04-19 MED ORDER — LACTATED RINGERS IV SOLN: INTRAVENOUS | Status: DC

## 2019-04-19 MED ORDER — ACETAMINOPHEN 325 MG PO TABS: 650.0000 mg | ORAL_TABLET | ORAL | Status: DC | PRN

## 2019-04-19 MED ORDER — DIPHENHYDRAMINE HCL 25 MG PO CAPS: 25.0000 mg | ORAL_CAPSULE | Freq: Four times a day (QID) | ORAL | Status: DC | PRN

## 2019-04-19 MED ORDER — OXYCODONE-ACETAMINOPHEN 5-325 MG PO TABS: 1.0000 | ORAL_TABLET | ORAL | Status: DC | PRN

## 2019-04-19 MED ORDER — LIDOCAINE HCL (PF) 1 % IJ SOLN: 30.0000 mL | INTRAMUSCULAR | Status: DC | PRN

## 2019-04-19 MED ORDER — ONDANSETRON HCL 4 MG/2ML IJ SOLN: 4.0000 mg | Freq: Four times a day (QID) | INTRAMUSCULAR | Status: DC | PRN

## 2019-04-19 MED ORDER — ONDANSETRON HCL 4 MG/2ML IJ SOLN: 4.0000 mg | INTRAMUSCULAR | Status: DC | PRN

## 2019-04-19 MED ORDER — COCONUT OIL OIL: 1.0000 "application " | TOPICAL_OIL | Status: DC | PRN

## 2019-04-19 MED ORDER — MISOPROSTOL 25 MCG QUARTER TABLET
25.0000 ug | ORAL_TABLET | ORAL | Status: DC | PRN
  Filled 2019-04-19: qty 1

## 2019-04-19 MED ORDER — OXYCODONE-ACETAMINOPHEN 5-325 MG PO TABS: 2.0000 | ORAL_TABLET | ORAL | Status: DC | PRN

## 2019-04-19 MED ORDER — IBUPROFEN 600 MG PO TABS
600.0000 mg | ORAL_TABLET | Freq: Four times a day (QID) | ORAL | Status: DC
  Administered 2019-04-19 – 2019-04-20 (×4): 600 mg via ORAL
  Filled 2019-04-19 (×4): qty 1

## 2019-04-19 MED ORDER — SIMETHICONE 80 MG PO CHEW: 80.0000 mg | CHEWABLE_TABLET | ORAL | Status: DC | PRN

## 2019-04-19 MED ORDER — TERBUTALINE SULFATE 1 MG/ML IJ SOLN
0.2500 mg | Freq: Once | INTRAMUSCULAR | Status: AC | PRN
  Administered 2019-04-19: 0.25 mg via SUBCUTANEOUS
  Filled 2019-04-19: qty 1

## 2019-04-19 MED ORDER — PENICILLIN G POT IN DEXTROSE 60000 UNIT/ML IV SOLN
3.0000 10*6.[IU] | INTRAVENOUS | Status: DC
  Administered 2019-04-19 (×2): 3 10*6.[IU] via INTRAVENOUS
  Filled 2019-04-19 (×2): qty 50

## 2019-04-19 MED ORDER — DIBUCAINE (PERIANAL) 1 % EX OINT: 1.0000 "application " | TOPICAL_OINTMENT | CUTANEOUS | Status: DC | PRN

## 2019-04-19 MED ORDER — BENZOCAINE-MENTHOL 20-0.5 % EX AERO: 1.0000 "application " | INHALATION_SPRAY | CUTANEOUS | Status: DC | PRN

## 2019-04-19 MED ORDER — WITCH HAZEL-GLYCERIN EX PADS: 1.0000 "application " | MEDICATED_PAD | CUTANEOUS | Status: DC | PRN

## 2019-04-19 MED ORDER — LACTATED RINGERS IV SOLN: 500.0000 mL | INTRAVENOUS | Status: DC | PRN

## 2019-04-19 MED ORDER — ZOLPIDEM TARTRATE 5 MG PO TABS: 5.0000 mg | ORAL_TABLET | Freq: Every evening | ORAL | Status: DC | PRN

## 2019-04-19 MED ORDER — OXYTOCIN BOLUS FROM INFUSION
500.0000 mL | Freq: Once | INTRAVENOUS | Status: AC
  Administered 2019-04-19: 11:00:00 500 mL via INTRAVENOUS

## 2019-04-19 MED ORDER — FENTANYL CITRATE (PF) 100 MCG/2ML IJ SOLN
100.0000 ug | INTRAMUSCULAR | Status: DC | PRN
  Administered 2019-04-19 (×2): 100 ug via INTRAVENOUS
  Filled 2019-04-19 (×2): qty 2

## 2019-04-19 MED ORDER — SENNOSIDES-DOCUSATE SODIUM 8.6-50 MG PO TABS
2.0000 | ORAL_TABLET | ORAL | Status: DC
  Administered 2019-04-19: 2 via ORAL
  Filled 2019-04-19: qty 2

## 2019-04-19 MED ORDER — OXYTOCIN 40 UNITS IN NORMAL SALINE INFUSION - SIMPLE MED: 1.0000 m[IU]/min | INTRAVENOUS | Status: DC

## 2019-04-19 MED ORDER — OXYTOCIN 40 UNITS IN NORMAL SALINE INFUSION - SIMPLE MED
2.5000 [IU]/h | INTRAVENOUS | Status: DC
  Filled 2019-04-19: qty 1000

## 2019-04-19 NOTE — Progress Notes (Signed)
Interpreter Meredeth Ide [phone] helped me translate to mom about baby's blood sugars. Assessed pain, and taught about the call light. Also stating if she needs anything just press it, she doesn't have to say anything and we will come in. She can point to the translator and we will call for interpretation. Through the interpreter pt states she understands.

## 2019-04-19 NOTE — Discharge Summary (Signed)
Arabic interpreter Saif #[phone] used for this encounter.    Postpartum Discharge Summary     Patient Name: Lisa Fleming DOB: 08/11/1985 MRN: [id number]  Date of admission: 04/19/2019 Delivering Provider: NEILL, CAROLINE M   Date of discharge: 04/20/2019  Admitting diagnosis: Gestational diabetes [O24.419] Intrauterine pregnancy: [redacted]w[redacted]d     Secondary diagnosis:  Principal Problem:   PROM (premature rupture of membranes)  Additional problems: A2GDM     Discharge diagnosis: Term Pregnancy Delivered and GDM A1                                                                                                Post partum procedures: none  Augmentation: Cytotec  Complications: None  Hospital course:  Onset of Labor With Vaginal Delivery     33 y.o. yo G5P3013 at [redacted]w[redacted]d was admitted in Latent Labor on 04/19/2019. Patient had an uncomplicated labor course as follows: cytotec x1 with rapid delivery after Membrane Rupture Time/Date: 12:30 AM ,04/19/2019   Intrapartum Procedures: Episiotomy: None [1]                                         Lacerations:  None [1]  Patient had a delivery of a Viable infant. 04/19/2019  Information for the patient's newborn:  Boice, Boy Lux [[id number]]  Delivery Method: Vaginal, Spontaneous(Filed from Delivery Summary)     Pateint had an uncomplicated postpartum course.  She is ambulating, tolerating a regular diet, passing flatus, and urinating well. Patient is discharged home in stable condition on 04/20/19.  Delivery time: 10:31 AM    Magnesium Sulfate received: No BMZ received: No Rhophylac:N/A MMR:N/A Transfusion:No  Physical exam  Vitals:   04/19/19 1600 04/19/19 1940 04/19/19 2329 04/20/19 0559  BP: (!) 101/59 (!) 107/59 (!) 108/54 (!) 108/54  Pulse: 68 74 75 69  Resp: 16 18 16 18  Temp: 98.2 F (36.8 C) 98.4 F (36.9 C) 97.7 F (36.5 C) 98.1 F (36.7 C)  TempSrc:  Oral Oral Oral  SpO2: 98% 99% 100% 100%  Weight:      Height:        General: alert, cooperative and no distress Lochia: appropriate Uterine Fundus: firm Incision: N/A DVT Evaluation: No evidence of DVT seen on physical exam. Labs: Lab Results  Component Value Date   WBC 7.0 04/19/2019   HGB 13.5 04/19/2019   HCT 38.9 04/19/2019   MCV 89.6 04/19/2019   PLT 134 (L) 04/19/2019   No flowsheet data found.  Discharge instruction: per After Visit Summary and "Baby and Me Booklet".  After visit meds:  Allergies as of 04/20/2019   No Known Allergies     Medication List    STOP taking these medications   metFORMIN 500 MG tablet Commonly known as: GLUCOPHAGE     TAKE these medications   acetaminophen 325 MG tablet Commonly known as: Tylenol Take 2 tablets (650 mg total) by mouth every 4 (four) hours as needed (for pain scale < 4).   clotrimazole 1 % vaginal   cream Commonly known as: GYNE-LOTRIMIN Place 1 Applicatorful vaginally at bedtime.   freestyle lancets Check Blood Glucose 4 x a day. Fasting and 2 hours after breakfast, lunch and dinner.   glucose blood test strip Test Blood Glucose 4 x a day. Fasting and 2 hours after breakfast, lunch and dinner.   glucose monitoring kit monitoring kit 1 each by Does not apply route as needed for other.   prenatal vitamin w/FE, FA 27-1 MG Tabs tablet Take 1 tablet by mouth daily at 12 noon.       Diet: routine diet  Activity: Advance as tolerated. Pelvic rest for 6 weeks.   Outpatient follow up:4 weeks Follow up Appt:No future appointments. Follow up Visit:  Please schedule this patient for PP visit in: 4 weeks High risk pregnancy complicated by: GDM Delivery mode:  SVD Anticipated Birth Control:  IUD PP Procedures needed: 2 hour GTT  Schedule Integrated BH visit: no Provider: Any provider  Newborn Data: Live born female  Birth Weight:  3269 g APGAR: 9,9   Newborn Delivery   Birth date/time: 04/19/2019 10:31:00 Delivery type: Vaginal, Spontaneous      Baby Feeding:  Bottle Disposition:home with mother

## 2019-04-19 NOTE — Lactation Note (Signed)
This note was copied from a baby's chart. Lactation Consultation Note  Patient Name: Lisa Fleming Date: 04/19/2019 Reason for consult: Initial assessment;Term  Interpretor: [name] via telephone.  I conducted an initial consult with Lisa Fleming. She reports that baby latched after delivery and has been sleepy with last attempt. She was concerned that baby may need formula.    I educated on day one infant feeding patterns, and tried to provide reassurance that is it normal for her 76 hour old son to be sleepy at this time. I offered to assist, and she consented.  I helped wake baby up. I changed a meconium and wet diaper first, and then helped Lisa Fleming place him in cradle hold on her right breast. Her breasts are WNL; she has everted nipples. Baby did some licking and did latch with suckling sequences. He did appear sleepy at the breast with slightly shallow latch.  I conducted teaching at the bedside regarding feeding cues, feeding patterns at day 1, and feeding frequency.   Lisa Fleming is unaccompanied tonight. She requested that her RN take baby to the nursery so she could rest a bit. I reinforced breast feeding on demand 8-12 times a day and importance of STS. I related that I would notify her RN of this request, and I did so.  RN states that Lisa Fleming had reduced breast milk supply at 6 months with previous children. Her husband did ask questions about medications to increase breast milk volume; I encouraged frequent breast stimulation via feeding on demand at this time. We did discuss lactogenic foods as well.  I encouraged Lisa Fleming to call for support this evening as needed.  Maternal Data Formula Feeding for Exclusion: No Does the patient have breastfeeding experience prior to this delivery?: Yes  Feeding Feeding Type: Breast Fed  LATCH Score Latch: Repeated attempts needed to sustain latch, nipple held in mouth throughout feeding,  stimulation needed to elicit sucking reflex.  Audible Swallowing: None  Type of Nipple: Everted at rest and after stimulation  Comfort (Breast/Nipple): Soft / non-tender  Hold (Positioning): Assistance needed to correctly position infant at breast and maintain latch.  LATCH Score: 6  Interventions Interventions: Breast feeding basics reviewed;Assisted with latch;Skin to skin;Breast compression;Support pillows  Lactation Tools Discussed/Used Tools: Other (comment)(interpretor: significant other)   Consult Status Consult Status: Follow-up Date: 04/20/19 Follow-up type: In-patient    Lenore Manner 04/19/2019, 6:48 PM

## 2019-04-19 NOTE — Progress Notes (Signed)
Lisa Fleming is a 33 y.o. N1B1660 at [redacted]w[redacted]d admitted for PROM  Subjective: Contractions have not gotten more painful. Still feel like mild cramping. Leaking fluid is still clear  Objective: BP (!) 98/56 (BP Location: Left Arm)   Pulse 83   Temp 98.1 F (36.7 C) (Oral)   Resp 20   Ht 5\' 2"  (1.575 m)   Wt 57.6 kg   LMP 07/12/2018 (Exact Date)   BMI 23.23 kg/m  No intake/output data recorded.  FHT:  FHR: 125 bpm, variability: moderate,  accelerations:  Present,  decelerations:  Absent UC:   irregular, every 2-5 minutes  SVE:   Dilation: 3 Effacement (%): 50 Station: -2 Exam by:: Dr Darene Lamer   Labs: Lab Results  Component Value Date   WBC 7.0 04/19/2019   HGB 13.5 04/19/2019   HCT 38.9 04/19/2019   MCV 89.6 04/19/2019   PLT 134 (L) 04/19/2019    Assessment / Plan: Lisa Fleming is a 33 y.o. A0O4599 at [redacted]w[redacted]d here for SOL with SROM at [place].  1. Labor: No cervical change, cervix thick and firm. Will give cyto for cervical ripening 2. FWB: Cat I tracing. Did have a prolonged decel earlier that was related to a position change. 3. Pain: per patient request 4. GBS: Positive, PCN. 4. A2GDM: CBGs q4h, q2h when in active labor. Most recent CBG 75.  Merilyn Baba DO OB Fellow, Faculty Practice 04/19/2019, 5:10 AM

## 2019-04-19 NOTE — Progress Notes (Signed)
Labor Progress Note Lisa Fleming is a 33 y.o. I2M3559 at [redacted]w[redacted]d presented for SROM  S:  Patient requesting more IV pain medication and reporting intermittent rectal pressure  O:  BP 121/66   Pulse 93   Temp 98.7 F (37.1 C) (Oral)   Resp 18   Ht 5\' 2"  (1.575 m)   Wt 57.6 kg   LMP 07/12/2018 (Exact Date)   SpO2 97%   BMI 23.23 kg/m   Fetal Tracing:  Baseline: 145 Variability: moderate Accels: 15x15 Decels: early  Toco: 2-4   CVE: Dilation: 6 Effacement (%): 100 Station: 0 Presentation: Vertex Exam by:: Maryruth Hancock, CNM   A&P: 33 y.o. R4B6384 [redacted]w[redacted]d SROM #Labor: Progressing well. Will hold pitocin at this time due to adequate cervical change.  #Pain: IV fentanyl #FWB: Cat 1 #GBS positive  Wende Mott, CNM 9:19 AM

## 2019-04-19 NOTE — Progress Notes (Signed)
Called by RN due to tachysystole with some variables.  FHR: 135 bpm, moderate variability, accels present, variables present. TOCO: CTX q1 minutes  Terbutaline given.  Merilyn Baba, DO OB Fellow, Faculty Practice 04/19/2019 6:21 AM

## 2019-04-19 NOTE — H&P (Addendum)
OBSTETRIC ADMISSION HISTORY AND PHYSICAL  Lisa Fleming is a 33 y.o. female [phone] with IUP at 28w0dpresenting for SOL with SROM at [place] She reports +FMs. No LOF, VB, blurry vision, headaches, peripheral edema, or RUQ pain. She plans on breastfeeding. She requests outpatient Copper IUD for birth control.  Dating: By First trimester [place]--->  Estimated Date of Delivery: 04/26/19  Sono:   @[redacted]w[redacted]d , normal anatomy, cephalic presentation, 33762G 60%ile, EFW 6#12  Prenatal History/Complications: Advanced Maternal Age GBS+ Gestation DM, on Metformin  Past Medical History: Past Medical History:  Diagnosis Date  . Gestational diabetes     Past Surgical History: Past Surgical History:  Procedure Laterality Date  . CHOLECYSTECTOMY  2018    Obstetrical History: OB History    Gravida  5   Para  3   Term  3   Preterm  0   AB  1   Living  3     SAB  1   TAB  0   Ectopic  0   Multiple  0   Live Births  3           Social History: Social History   Socioeconomic History  . Marital status: Married    Spouse name: Not on file  . Number of children: Not on file  . Years of education: Not on file  . Highest education level: Not on file  Occupational History  . Not on file  Tobacco Use  . Smoking status: Never Smoker  . Smokeless tobacco: Never Used  Substance and Sexual Activity  . Alcohol use: Never  . Drug use: Never  . Sexual activity: Yes  Other Topics Concern  . Not on file  Social History Narrative  . Not on file   Social Determinants of Health   Financial Resource Strain:   . Difficulty of Paying Living Expenses: Not on file  Food Insecurity: No Food Insecurity  . Worried About RCharity fundraiserin the Last Year: Never true  . Ran Out of Food in the Last Year: Never true  Transportation Needs: No Transportation Needs  . Lack of Transportation (Medical): No  . Lack of Transportation (Non-Medical): No  Physical Activity:   . Days of Exercise  per Week: Not on file  . Minutes of Exercise per Session: Not on file  Stress:   . Feeling of Stress : Not on file  Social Connections:   . Frequency of Communication with Friends and Family: Not on file  . Frequency of Social Gatherings with Friends and Family: Not on file  . Attends Religious Services: Not on file  . Active Member of Clubs or Organizations: Not on file  . Attends CArchivistMeetings: Not on file  . Marital Status: Not on file    Family History: Family History  Problem Relation Age of Onset  . Diabetes Mother   . Heart disease Father     Allergies: No Known Allergies  Medications Prior to Admission  Medication Sig Dispense Refill Last Dose  . glucose blood test strip Test Blood Glucose 4 x a day. Fasting and 2 hours after breakfast, lunch and dinner. 100 each 12 04/18/2019 at Unknown time  . glucose monitoring kit (FREESTYLE) monitoring kit 1 each by Does not apply route as needed for other. 1 each 0 04/18/2019 at Unknown time  . Lancets (FREESTYLE) lancets Check Blood Glucose 4 x a day. Fasting and 2 hours after breakfast, lunch and dinner. 100 each  12 04/18/2019 at Unknown time  . metFORMIN (GLUCOPHAGE) 500 MG tablet Take 1 tablet (500 mg total) by mouth 2 (two) times daily with a meal. 60 tablet 5 04/18/2019 at Unknown time  . prenatal vitamin w/FE, FA (PRENATAL 1 + 1) 27-1 MG TABS tablet Take 1 tablet by mouth daily at 12 noon. 30 tablet 2 04/18/2019 at Unknown time  . clotrimazole (GYNE-LOTRIMIN) 1 % vaginal cream Place 1 Applicatorful vaginally at bedtime. 30 g 2 Unknown at Unknown time     Review of Systems:  All systems reviewed and negative except as stated in HPI  PE: Last menstrual period 07/12/2018. General appearance: alert, cooperative and appears stated age Lungs: regular rate and effort Heart: regular rate  Abdomen: soft, non-tender Extremities: Homans sign is negative, no sign of DVT Presentation: cephalic EFM: 286 bpm,  moderate variability, 15x15 accels, no decels Toco: CTX q37mnutes Dilation: 3 Effacement (%): 50 Station: -2 Exam by:: CDenyse DagoRN   Prenatal labs: ABO, Rh: --/--/O POS (05/11 1718) Antibody: Negative (05/11 0000) Rubella: Immune (05/11 0000) RPR: Nonreactive (05/11 0000)  HBsAg: Negative (05/11 0000)  HIV: Non-reactive (05/11 0000)  GBS: Positive/-- (12/07 1114)  2 hr GTT 76/192/137  Prenatal Transfer Tool  Maternal Diabetes: Yes:  Diabetes Type:  Insulin/Medication controlled, gestational Genetic Screening: Declined Maternal Ultrasounds/Referrals: Normal Fetal Ultrasounds or other Referrals:  Referred to Materal Fetal Medicine  Maternal Substance Abuse:  No Significant Maternal Medications:  None Significant Maternal Lab Results: Group B Strep positive  Results for orders placed or performed during the hospital encounter of 04/19/19 (from the past 24 hour(s))  Fern Test   Collection Time: 04/19/19  1:16 AM  Result Value Ref Range   POCT Fern Test Positive = ruptured amniotic membanes     Patient Active Problem List   Diagnosis Date Noted  . Language barrier 04/09/2019  . GBS (group B Streptococcus carrier), +RV culture, currently pregnant 04/06/2019  . Gestational diabetes mellitus (GDM) affecting pregnancy 02/23/2019  . Limited prenatal care, antepartum 02/20/2019  . Supervision of high risk pregnancy, antepartum 02/19/2019  . History of gestational diabetes in prior pregnancy, currently pregnant 02/19/2019  . AMA (advanced maternal age) multigravida 35+ 02/19/2019    Assessment: Lisa Coppinis a 33y.o. [id number] 347w0dere for SOL with SROM at [place] 1. Labor: expectant management. Augmentation as appropriate. 2. FWB: Cat I tracing, EFW 3500g. 3. Pain: per patient request 4. GBS: Positive, will treat with PCN. 4. A2GDM: CBGs q4h, q2h when in active labor.   Plan: Admit to Labor and Delivery, anticipate NSVD  Lisa Firkus L Coy Vandoren, DO  04/19/2019, 1:20  AM

## 2019-04-19 NOTE — MAU Note (Signed)
Pt presents to MAU c/o possible SROM at 0030 clear fluid. Pt states she is having mild ctx maybe every 5+min. Pt reports +FM today but in the last 30 min she hasn't felt the movement.

## 2019-04-20 LAB — CBC
HCT: 36.1 % (ref 36.0–46.0)
Hemoglobin: 12.6 g/dL (ref 12.0–15.0)
MCH: 31.7 pg (ref 26.0–34.0)
MCHC: 34.9 g/dL (ref 30.0–36.0)
MCV: 90.7 fL (ref 80.0–100.0)
Platelets: 129 10*3/uL — ABNORMAL LOW (ref 150–400)
RBC: 3.98 MIL/uL (ref 3.87–5.11)
RDW: 13.6 % (ref 11.5–15.5)
WBC: 14.1 10*3/uL — ABNORMAL HIGH (ref 4.0–10.5)
nRBC: 0 % (ref 0.0–0.2)

## 2019-04-20 MED ORDER — ACETAMINOPHEN 325 MG PO TABS: 650.0000 mg | ORAL_TABLET | ORAL | 0 refills | Status: AC | PRN

## 2019-04-20 NOTE — Progress Notes (Signed)
Discharge education reviewed with patient and significant other using stratus interpreter "Zahra" # [phone]. Maxwell Caul, Leretha Dykes [place]

## 2019-04-20 NOTE — Discharge Instructions (Signed)
Postpartum Care After Vaginal Delivery This sheet gives you information about how to care for yourself from the time you deliver your baby to up to 6-12 weeks after delivery (postpartum period). Your health care provider may also give you more specific instructions. If you have problems or questions, contact your health care provider. Follow these instructions at home: Vaginal bleeding  It is normal to have vaginal bleeding (lochia) after delivery. Wear a sanitary pad for vaginal bleeding and discharge. ? During the first week after delivery, the amount and appearance of lochia is often similar to a menstrual period. ? Over the next few weeks, it will gradually decrease to a dry, yellow-brown discharge. ? For most women, lochia stops completely by 4-6 weeks after delivery. Vaginal bleeding can vary from woman to woman.  Change your sanitary pads frequently. Watch for any changes in your flow, such as: ? A sudden increase in volume. ? A change in color. ? Large blood clots.  If you pass a blood clot from your vagina, save it and call your health care provider to discuss. Do not flush blood clots down the toilet before talking with your health care provider.  Do not use tampons or douches until your health care provider says this is safe.  If you are not breastfeeding, your period should return 6-8 weeks after delivery. If you are feeding your child breast milk only (exclusive breastfeeding), your period may not return until you stop breastfeeding. Perineal care  Keep the area between the vagina and the anus (perineum) clean and dry as told by your health care provider. Use medicated pads and pain-relieving sprays and creams as directed.  If you had a cut in the perineum (episiotomy) or a tear in the vagina, check the area for signs of infection until you are healed. Check for: ? More redness, swelling, or pain. ? Fluid or blood coming from the cut or tear. ? Warmth. ? Pus or a bad  smell.  You may be given a squirt bottle to use instead of wiping to clean the perineum area after you go to the bathroom. As you start healing, you may use the squirt bottle before wiping yourself. Make sure to wipe gently.  To relieve pain caused by an episiotomy, a tear in the vagina, or swollen veins in the anus (hemorrhoids), try taking a warm sitz bath 2-3 times a day. A sitz bath is a warm water bath that is taken while you are sitting down. The water should only come up to your hips and should cover your buttocks. Breast care  Within the first few days after delivery, your breasts may feel heavy, full, and uncomfortable (breast engorgement). Milk may also leak from your breasts. Your health care provider can suggest ways to help relieve the discomfort. Breast engorgement should go away within a few days.  If you are breastfeeding: ? Wear a bra that supports your breasts and fits you well. ? Keep your nipples clean and dry. Apply creams and ointments as told by your health care provider. ? You may need to use breast pads to absorb milk that leaks from your breasts. ? You may have uterine contractions every time you breastfeed for up to several weeks after delivery. Uterine contractions help your uterus return to its normal size. ? If you have any problems with breastfeeding, work with your health care provider or lactation consultant.  If you are not breastfeeding: ? Avoid touching your breasts a lot. Doing this can make   your breasts produce more milk. ? Wear a good-fitting bra and use cold packs to help with swelling. ? Do not squeeze out (express) milk. This causes you to make more milk. Intimacy and sexuality  Ask your health care provider when you can engage in sexual activity. This may depend on: ? Your risk of infection. ? How fast you are healing. ? Your comfort and desire to engage in sexual activity.  You are able to get pregnant after delivery, even if you have not had  your period. If desired, talk with your health care provider about methods of birth control (contraception). Medicines  Take over-the-counter and prescription medicines only as told by your health care provider.  If you were prescribed an antibiotic medicine, take it as told by your health care provider. Do not stop taking the antibiotic even if you start to feel better. Activity  Gradually return to your normal activities as told by your health care provider. Ask your health care provider what activities are safe for you.  Rest as much as possible. Try to rest or take a nap while your baby is sleeping. Eating and drinking   Drink enough fluid to keep your urine pale yellow.  Eat high-fiber foods every day. These may help prevent or relieve constipation. High-fiber foods include: ? Whole grain cereals and breads. ? Brown rice. ? Beans. ? Fresh fruits and vegetables.  Do not try to lose weight quickly by cutting back on calories.  Take your prenatal vitamins until your postpartum checkup or until your health care provider tells you it is okay to stop. Lifestyle  Do not use any products that contain nicotine or tobacco, such as cigarettes and e-cigarettes. If you need help quitting, ask your health care provider.  Do not drink alcohol, especially if you are breastfeeding. General instructions  Keep all follow-up visits for you and your baby as told by your health care provider. Most women visit their health care provider for a postpartum checkup within the first 3-6 weeks after delivery. Contact a health care provider if:  You feel unable to cope with the changes that your child brings to your life, and these feelings do not go away.  You feel unusually sad or worried.  Your breasts become red, painful, or hard.  You have a fever.  You have trouble holding urine or keeping urine from leaking.  You have little or no interest in activities you used to enjoy.  You have not  breastfed at all and you have not had a menstrual period for 12 weeks after delivery.  You have stopped breastfeeding and you have not had a menstrual period for 12 weeks after you stopped breastfeeding.  You have questions about caring for yourself or your baby.  You pass a blood clot from your vagina. Get help right away if:  You have chest pain.  You have difficulty breathing.  You have sudden, severe leg pain.  You have severe pain or cramping in your lower abdomen.  You bleed from your vagina so much that you fill more than one sanitary pad in one hour. Bleeding should not be heavier than your heaviest period.  You develop a severe headache.  You faint.  You have blurred vision or spots in your vision.  You have bad-smelling vaginal discharge.  You have thoughts about hurting yourself or your baby. If you ever feel like you may hurt yourself or others, or have thoughts about taking your own life, get help  right away. You can go to the nearest emergency department or call:  Your local emergency services (911 in the [place]  A suicide crisis helpline, such as the [facility] at 317 645 8512. This is open 24 hours a day. Summary  The period of time right after you deliver your newborn up to 6-12 weeks after delivery is called the postpartum period.  Gradually return to your normal activities as told by your health care provider.  Keep all follow-up visits for you and your baby as told by your health care provider. This information is not intended to replace advice given to you by your health care provider. Make sure you discuss any questions you have with your health care provider. Document Released: 02/04/2007 Document Revised: 04/12/2017 Document Reviewed: 01/21/2017 Elsevier Patient Education  2020 [place] to have your son circumcised:                                                                      [facility]     [phone] while you are in hospital         [facility]              [phone]   $269 by 4 wks                      [facility]                     [phone]   $269 by 7 days [facility]                    [phone]   $269 by 4 wks [facility]             [phone]   $225 by 2 wks    These prices sometimes change but are roughly what you can expect to pay. Please call and confirm pricing.   Circumcision is considered an elective/non-medically necessary procedure. There are many reasons parents decide to have their sons circumsized. During the first year of life circumcised males have a reduced risk of urinary tract infections but after this year the rates between circumcised males and uncircumcised males are the same.  It is safe to have your son circumcised outside of the hospital and the places above perform them regularly.   Deciding about Circumcision in Baby Boys  (Up-to-date The Basics)  What is circumcision?   Circumcision is a surgery that removes the skin that covers the tip of the penis, called the "foreskin" Circumcision is usually done when a boy is between 74 and 42 days old. In the [place], circumcision is common. In some other countries, fewer boys are circumcised. Circumcision is a common tradition in some religions.  Should I have my baby boy circumcised?   There is no easy answer. Circumcision has some benefits. But it also has risks. After talking with your doctor, you will have to decide for yourself what is right for your family.  What are the benefits of circumcision?   Circumcised boys seem to have slightly lower rates of: ?Urinary tract infections ?Swelling of the opening at the tip of the penis Circumcised men seem  to have slightly lower rates of: ?Urinary tract infections ?Swelling of the opening at the tip of the penis ?Penis cancer ?HIV and other infections that you catch during sex ?Cervical cancer in the women they have sex with Even so, in the [place],  the risks of these problems are small - even in boys and men who have not been circumcised. Plus, boys and men who are not circumcised can reduce these extra risks by: ?Cleaning their penis well ?Using condoms during sex  What are the risks of circumcision?  Risks include: ?Bleeding or infection from the surgery ?Damage to or amputation of the penis ?A chance that the doctor will cut off too much or not enough of the foreskin ?A chance that sex won't feel as good later in life Only about 1 out of every 200 circumcisions leads to problems. There is also a chance that your health insurance won't pay for circumcision.  How is circumcision done in baby boys?  First, the baby gets medicine for pain relief. This might be a cream on the skin or a shot into the base of the penis. Next, the doctor cleans the baby's penis well. Then he or she uses special tools to cut off the foreskin. Finally, the doctor wraps a bandage (called gauze) around the baby's penis. If you have your baby circumcised, his doctor or nurse will give you instructions on how to care for him after the surgery. It is important that you follow those instructions carefully.

## 2019-05-22 ENCOUNTER — Ambulatory Visit: Payer: Medicaid Other | Admitting: Obstetrics & Gynecology

## 2019-05-25 ENCOUNTER — Ambulatory Visit (INDEPENDENT_AMBULATORY_CARE_PROVIDER_SITE_OTHER): Payer: Medicaid Other | Admitting: Nurse Practitioner

## 2019-05-25 ENCOUNTER — Other Ambulatory Visit: Payer: Self-pay

## 2019-05-25 ENCOUNTER — Encounter: Payer: Self-pay | Admitting: Nurse Practitioner

## 2019-05-25 DIAGNOSIS — Z30430 Encounter for insertion of intrauterine contraceptive device: Secondary | ICD-10-CM

## 2019-05-25 DIAGNOSIS — Z3202 Encounter for pregnancy test, result negative: Secondary | ICD-10-CM

## 2019-05-25 DIAGNOSIS — O24419 Gestational diabetes mellitus in pregnancy, unspecified control: Secondary | ICD-10-CM

## 2019-05-25 DIAGNOSIS — Z975 Presence of (intrauterine) contraceptive device: Secondary | ICD-10-CM

## 2019-05-25 LAB — POCT PREGNANCY, URINE: Preg Test, Ur: NEGATIVE

## 2019-05-25 MED ORDER — PARAGARD INTRAUTERINE COPPER IU IUD: INTRAUTERINE_SYSTEM | Freq: Once | INTRAUTERINE | Status: AC

## 2019-05-25 NOTE — Progress Notes (Signed)
Subjective:     Lisa Fleming is a 34 y.o. female who presents for a postpartum visit. She is 5 weeks postpartum following a spontaneous vaginal delivery. I have fully reviewed the prenatal and intrapartum course. The delivery was at 39 gestational weeks. Outcome: spontaneous vaginal delivery. Anesthesia: none. Postpartum course has been Good. Baby's course has been Good. Baby is feeding by both breast and bottle - [place] Good Start. Bleeding no bleeding. Bowel function is normal. Bladder function is normal. Patient is not sexually active. Contraception method is none. Postpartum depression screening: negative. In person interpreter present with her for the entire visit.  The following portions of the patient's history were reviewed and updated as appropriate: allergies, current medications, past family history, past medical history, past social history, past surgical history and problem list.  Review of Systems Pertinent items noted in HPI and remainder of comprehensive ROS otherwise negative.   Objective:    BP 115/81   Pulse 71   Ht 5\' 2"  (1.575 m)   Wt 114 lb 12.8 oz (52.1 kg)   LMP 07/12/2018 (Exact Date)   BMI 21.00 kg/m   General:  alert, cooperative and no distress   Breasts:  deferred - breasfeeding  Lungs: clear to auscultation bilaterally  Heart:  regular rate and rhythm, S1, S2 normal, no murmur, click, rub or gallop  Abdomen: Soft, nontender   Vulva:  normal  Vagina: normal vagina  Cervix:  no lesions  Corpus: normal size, contour, position, consistency, mobility, non-tender  Adnexa:  normal adnexa  Rectal Exam: Not performed.        IUD Insertion Procedure Note Long discussion of types of IUDs and client very insistent that she wants no hormones.  Had Liletta IUD before but strongly wants regular menstrual period even if it might be slightly longer or slightly more cramping with no hormones.    Patient identified, informed consent performed, consent signed.    Discussed risks of irregular bleeding, cramping, infection, malpositioning or misplacement of the IUD outside the uterus which may require further procedure such as laparoscopy. Time out was performed.  Urine pregnancy test negative.  Speculum placed in the vagina.  Cervix visualized.  Cleaned with Betadine x 2.  Hurricane spray used.  Grasped anteriorly with a single tooth tenaculum. Tenaculum came through the cervical tissue easily with traction as it was a superficial placement.  Tenaculum replaced deeper into posterior cervix.  Some bleeding noted.  Uterus sounded to 10 cm.  Paragard IUD placed per manufacturer's recommendations.  Strings trimmed to 3 cm. Tenaculum was removed, and with pressure from several swabs and 2 silver nitrate sticks, good hemostasis noted.  Patient tolerated procedure well.   Patient was given post-procedure instructions.  She was advised to have backup contraception for one week.  Patient was also asked to check IUD strings periodically and follow up in 4 weeks for IUD check.    Assessment:    Normal postpartum exam. Pap smear not done at today's visit.  Paragard IUD inserted.  Plan:    1. Contraception: IUD 2. Return in 4 weeks for IUD string check. 3.  Maintain current weight.  07/14/2018, RN, MSN, NP-BC Nurse Practitioner, [facility] for [facility] 05-25-19 5:15 PM

## 2019-05-26 DIAGNOSIS — Z975 Presence of (intrauterine) contraceptive device: Secondary | ICD-10-CM | POA: Insufficient documentation

## 2019-05-29 ENCOUNTER — Other Ambulatory Visit: Payer: Self-pay | Admitting: *Deleted

## 2019-05-29 DIAGNOSIS — Z8632 Personal history of gestational diabetes: Secondary | ICD-10-CM

## 2019-06-02 ENCOUNTER — Other Ambulatory Visit: Payer: Self-pay

## 2019-06-02 ENCOUNTER — Other Ambulatory Visit: Payer: Medicaid Other

## 2019-06-02 DIAGNOSIS — Z8632 Personal history of gestational diabetes: Secondary | ICD-10-CM

## 2019-06-03 LAB — GLUCOSE TOLERANCE, 2 HOURS
Glucose, 2 hour: 87 mg/dL (ref 65–139)
Glucose, GTT - Fasting: 83 mg/dL (ref 65–99)

## 2019-06-30 ENCOUNTER — Ambulatory Visit (INDEPENDENT_AMBULATORY_CARE_PROVIDER_SITE_OTHER): Payer: Medicaid Other | Admitting: Nurse Practitioner

## 2019-06-30 ENCOUNTER — Encounter: Payer: Self-pay | Admitting: Nurse Practitioner

## 2019-06-30 ENCOUNTER — Other Ambulatory Visit: Payer: Self-pay

## 2019-06-30 VITALS — BP 114/79 | HR 106 | Ht 62.0 in | Wt 116.2 lb

## 2019-06-30 DIAGNOSIS — Z789 Other specified health status: Secondary | ICD-10-CM | POA: Diagnosis not present

## 2019-06-30 DIAGNOSIS — Z30431 Encounter for routine checking of intrauterine contraceptive device: Secondary | ICD-10-CM

## 2019-06-30 NOTE — Progress Notes (Signed)
    GYNECOLOGY OFFICE ENCOUNTER NOTE  History:  34 y.o. [id number] here today for today for IUD string check; Paragard  IUD was placed 05-25-19.  No complaints about the IUD, no concerning side effects.  Has had intercourse and there were no problems with her or her partner.  Has had some "pinching" type of sensation periodically in her lower abdomen.  Has had one menstrual cycle 6 days in length and similar to her other menstrual cycles.  Content with her IUD.  The following portions of the patient's history were reviewed and updated as appropriate: allergies, current medications, past family history, past medical history, past social history, past surgical history and problem list. Last pap smear on 12-05-17  was normal, negative HRHPV.  Review of Systems:  Pertinent items are noted in HPI.   Objective:  Physical Exam Blood pressure 114/79, pulse (!) 106, height 5\' 2"  (1.575 m), weight 116 lb 3.2 oz (52.7 kg), unknown if currently breastfeeding. CONSTITUTIONAL: Well-developed, well-nourished female in no acute distress.  HENT:  Normocephalic, atraumatic. External right and left ear normal. Oropharynx is clear and moist EYES: Conjunctivae and EOM are normal. Pupils are equal, round, and reactive to light. No scleral icterus.  NECK: Normal range of motion, supple, no masses CARDIOVASCULAR: Normal heart rate noted RESPIRATORY: Effort and breath sounds normal, no problems with respiration noted ABDOMEN: Soft, no distention noted.   PELVIC: Normal appearing external genitalia; normal appearing vaginal mucosa and cervix.  IUD strings visualized, unknown length as strings curled behind cervix.   Assessment & Plan:  Patient to keep IUD in place for up to 19 years; can come in for removal if she desires pregnancy earlier or for or concerning side effects. Advised to use Ibuprofen PO if she is having trouble with cramping or back pain. RTC in one year. See AVS for additional information.  , RN, MSN, NP-BC Nurse Practitioner, [facility] for [facility] 06/30/2019 12:00 PM

## 2019-07-02 ENCOUNTER — Encounter: Payer: Self-pay | Admitting: General Practice

## 2020-07-03 ENCOUNTER — Encounter: Payer: Self-pay | Admitting: Nurse Practitioner

## 2020-07-03 DIAGNOSIS — E559 Vitamin D deficiency, unspecified: Secondary | ICD-10-CM | POA: Insufficient documentation

## 2020-07-04 ENCOUNTER — Ambulatory Visit: Payer: Medicaid Other | Admitting: Nurse Practitioner

## 2020-10-06 ENCOUNTER — Other Ambulatory Visit: Payer: Self-pay | Admitting: Family Medicine

## 2020-10-06 ENCOUNTER — Ambulatory Visit
Admission: RE | Admit: 2020-10-06 | Discharge: 2020-10-06 | Disposition: A | Discharge status: Home / Self Care | Payer: Medicaid Other | Source: Ambulatory Visit | Attending: Family Medicine | Admitting: Family Medicine

## 2020-10-06 DIAGNOSIS — R059 Cough, unspecified: Secondary | ICD-10-CM

## 2020-10-16 ENCOUNTER — Encounter (HOSPITAL_COMMUNITY): Payer: Self-pay

## 2020-10-16 ENCOUNTER — Other Ambulatory Visit: Payer: Self-pay

## 2020-10-16 ENCOUNTER — Ambulatory Visit (HOSPITAL_COMMUNITY)
Admission: EM | Admit: 2020-10-16 | Discharge: 2020-10-16 | Disposition: A | Discharge status: Home / Self Care | Payer: Medicaid Other

## 2020-10-16 DIAGNOSIS — R0602 Shortness of breath: Secondary | ICD-10-CM | POA: Diagnosis not present

## 2020-10-16 DIAGNOSIS — R0789 Other chest pain: Secondary | ICD-10-CM

## 2020-10-16 NOTE — ED Provider Notes (Signed)
[place]   MRN: [id number] DOB: April 30, 1985  Subjective:   Lisa Fleming is a 35 y.o. female presenting for acute on chronic chest pain and shortness of breath.  Patient has had the symptoms for the better part of the year.  She had a completely normal work-up with her regular doctor, Dr. Deneise Lever 10/07/2020.  She also had follow-up and review all of her labs.  Has used omeprazole, pantoprazole and an albuterol inhaler previously without resolution of her symptoms.  Denies any change to her symptoms.  Denies history of asthma.  No smoking history.  The only medical condition patient had was gestational diabetes but her blood sugar has returned to normal postpartum.  No current facility-administered medications for this encounter.  Current Outpatient Medications:    UNABLE TO FIND, Med Name: pt reports taking medicine for stomach acid but does not remember the name., Disp: , Rfl:    acetaminophen (TYLENOL) 325 MG tablet, Take 2 tablets (650 mg total) by mouth every 4 (four) hours as needed (for pain scale < 4)., Disp: 30 tablet, Rfl: 0   clotrimazole (GYNE-LOTRIMIN) 1 % vaginal cream, Place 1 Applicatorful vaginally at bedtime. (Patient not taking: Reported on 05/25/2019), Disp: 30 g, Rfl: 2   glucose blood test strip, Test Blood Glucose 4 x a day. Fasting and 2 hours after breakfast, lunch and dinner. (Patient not taking: Reported on 05/25/2019), Disp: 100 each, Rfl: 12   glucose monitoring kit (FREESTYLE) monitoring kit, 1 each by Does not apply route as needed for other. (Patient not taking: Reported on 05/25/2019), Disp: 1 each, Rfl: 0   Lancets (FREESTYLE) lancets, Check Blood Glucose 4 x a day. Fasting and 2 hours after breakfast, lunch and dinner. (Patient not taking: Reported on 05/25/2019), Disp: 100 each, Rfl: 12   prenatal vitamin w/FE, FA (PRENATAL 1 + 1) 27-1 MG TABS tablet, Take 1 tablet by mouth daily at 12 noon., Disp: 30 tablet, Rfl: 2   No Known Allergies  Past  Medical History:  Diagnosis Date   Gestational diabetes      Past Surgical History:  Procedure Laterality Date   CHOLECYSTECTOMY  2018    Family History  Problem Relation Age of Onset   Diabetes Mother    Heart disease Father     Social History   Tobacco Use   Smoking status: Never   Smokeless tobacco: Never  Vaping Use   Vaping Use: Never used  Substance Use Topics   Alcohol use: Never   Drug use: Never    ROS   Objective:   Vitals: BP 117/61   Pulse 91   Temp 98.1 F (36.7 C) (Oral)   Resp 18   SpO2 96%   Physical Exam Constitutional:      General: She is not in acute distress.    Appearance: Normal appearance. She is well-developed. She is not ill-appearing, toxic-appearing or diaphoretic.  HENT:     Head: Normocephalic and atraumatic.     Nose: Nose normal.     Mouth/Throat:     Mouth: Mucous membranes are moist.  Eyes:     Extraocular Movements: Extraocular movements intact.     Pupils: Pupils are equal, round, and reactive to light.  Cardiovascular:     Rate and Rhythm: Normal rate and regular rhythm.     Pulses: Normal pulses.     Heart sounds: Normal heart sounds. No murmur heard.   No friction rub. No gallop.  Pulmonary:  Effort: Pulmonary effort is normal. No respiratory distress.     Breath sounds: Normal breath sounds. No stridor. No wheezing, rhonchi or rales.  Skin:    General: Skin is warm and dry.     Findings: No rash.  Neurological:     Mental Status: She is alert and oriented to person, place, and time.  Psychiatric:        Mood and Affect: Mood normal.        Behavior: Behavior normal.        Thought Content: Thought content normal.     Assessment and Plan :   PDMP not reviewed this encounter.  1. Shortness of breath   2. Atypical chest pain     Discussed with patient that repeating the chest x-ray today would add little medical value to her diagnosis as she had this completed 9 days ago.  She also had extensive  lab work done through her primary care provider which I reviewed with her.  Recommended continuing the medications that were prescribed to her for her current and chronic symptoms.  Also provided her with information to a pulmonologist practice for further work-up and evaluation but counseled that they may require referral from her regular doctor.  Patient has the lowest Wells criteria score and I have low suspicion for pulmonary embolism.  Therefore will defer ER visit for chest CT scan. Counseled patient on potential for adverse effects with medications prescribed/recommended today, ER and return-to-clinic precautions discussed, patient verbalized understanding.    Jaynee Eagles, [place] 10/16/20 1735

## 2020-10-16 NOTE — ED Triage Notes (Addendum)
Pt reports left side chest pain for 3 days and shob ongoing for one month. Pt reports having stomach pain in the mornings for years.

## 2020-10-16 NOTE — Discharge Instructions (Addendum)
Unfortunately you have failed treatment with medications that we could prescribe in the urgent care setting including pantoprazole and an albuterol inhaler.  I reviewed your labs with you again as your primary care provider did and everything has been normal.  Please make sure you follow-up with them to continue working on your chronic shortness of breath and chronic chest pain.  He can also contact a pulmonologist directly through [facility] but they may end up requiring a referral from your primary care doctor.  Please continue to use the medications as prescribed by your regular doctor.

## 2020-12-19 ENCOUNTER — Institutional Professional Consult (permissible substitution): Payer: Medicaid Other | Admitting: Pulmonary Disease

## 2021-01-03 ENCOUNTER — Ambulatory Visit (INDEPENDENT_AMBULATORY_CARE_PROVIDER_SITE_OTHER): Payer: Medicaid Other | Admitting: Pulmonary Disease

## 2021-01-03 ENCOUNTER — Other Ambulatory Visit: Payer: Self-pay

## 2021-01-03 ENCOUNTER — Encounter: Payer: Self-pay | Admitting: Pulmonary Disease

## 2021-01-03 VITALS — BP 104/70 | HR 71 | Temp 97.7°F | Ht 62.0 in | Wt 110.8 lb

## 2021-01-03 DIAGNOSIS — R0600 Dyspnea, unspecified: Secondary | ICD-10-CM

## 2021-01-03 DIAGNOSIS — R0609 Other forms of dyspnea: Secondary | ICD-10-CM

## 2021-01-03 LAB — CBC WITH DIFFERENTIAL/PLATELET
Basophils Absolute: 0.2 10*3/uL — ABNORMAL HIGH (ref 0.0–0.1)
Basophils Relative: 4 % — ABNORMAL HIGH (ref 0.0–3.0)
Eosinophils Absolute: 0.1 10*3/uL (ref 0.0–0.7)
Eosinophils Relative: 2.1 % (ref 0.0–5.0)
HCT: 40.7 % (ref 36.0–46.0)
Hemoglobin: 13.6 g/dL (ref 12.0–15.0)
Lymphocytes Relative: 43 % (ref 12.0–46.0)
Lymphs Abs: 1.9 10*3/uL (ref 0.7–4.0)
MCHC: 33.4 g/dL (ref 30.0–36.0)
MCV: 85.1 fl (ref 78.0–100.0)
Monocytes Absolute: 0.4 10*3/uL (ref 0.1–1.0)
Monocytes Relative: 9.4 % (ref 3.0–12.0)
Neutro Abs: 1.9 10*3/uL (ref 1.4–7.7)
Neutrophils Relative %: 41.5 % — ABNORMAL LOW (ref 43.0–77.0)
Platelets: 240 10*3/uL (ref 150.0–400.0)
RBC: 4.79 Mil/uL (ref 3.87–5.11)
RDW: 12.9 % (ref 11.5–15.5)
WBC: 4.5 10*3/uL (ref 4.0–10.5)

## 2021-01-03 MED ORDER — BUDESONIDE-FORMOTEROL FUMARATE 160-4.5 MCG/ACT IN AERO: 2.0000 | INHALATION_SPRAY | Freq: Two times a day (BID) | RESPIRATORY_TRACT | 6 refills | Status: DC

## 2021-01-03 NOTE — Patient Instructions (Signed)
Nice to meet you  Use Symbicort 2 puffs twice a day. Rinse mouth after every use.  We will schedule pulmonary function tests to help [place] understand your symptoms.  We will check a blood count today to make sure there is no anemia.  Return to clinic in 3 months or sooner as needed

## 2021-01-03 NOTE — Addendum Note (Signed)
Addended by: Demetrio Lapping E on: 01/03/2021 09:39 AM   Modules accepted: Orders

## 2021-01-03 NOTE — Progress Notes (Signed)
_0  ID: Lisa Fleming, female    DOB: 08/16/1985, 35 y.o.   MRN: [id number]  Chief Complaint  Patient presents with   Consult    Pt states SOB without exertion, sometimes feel stabbing pain.     Referring provider: Lois Huxley, PA  HPI:   35 y.o. woman whom we are seeing in consultation for evaluation of shortness of breath.  Urgent care note 10/16/2020 reviewed.  Most recent PCP note 09/2020 reviewed.  Patient reports 37-monthhistory of shortness of breath.  She cannot identify any precipitating event.  No illness, event.  No trauma.  Out of the blue.  Present with rest and exertion.  Not really worse with exertion.  Worse when she lies flat.  Occasional cough at night.  It is dry.  Not particular bothersome.  Feels like her chest is tight, heavy, hard to get a deep breath.  This occurs at rest.  Not worsened with exertion.  No seasonal or environmental factors she can identify to account for this.  No change in living environment.  No change in routine.  No time of day when things are better or worse.  She tried albuterol without significant improvement.  No other alleviating or exacerbating factors.  She denies seasonal allergies.  Chest x-ray obtained 10/06/2020 by PCP for cough revealed clear lungs bilaterally, mildly flattened diaphragms possibly suggestive of gas trapping on my review and interpretation.  Hemoglobin 14 In 10/2019.  PMH: GERD Surgery: Reviewed patient, denies any surgical history Family history: Reviewed with patient, denies history of respiratory illnesses in first-degree relative Social history: [place]  01/03/2021  - Visit     Questionaires / Pulmonary Flowsheets:   ACT:  No flowsheet data found.  MMRC: mMRC Dyspnea Scale mMRC Score  01/03/2021 3    Epworth:  No flowsheet data found.  Tests:   FENO:  No results found for: NITRICOXIDE  PFT: No flowsheet data found.  WALK:  No  flowsheet data found.  Imaging: Personally reviewed and as per EMR discussion this note  Lab Results: Personally reviewed, most recent CBC without anemia CBC    Component Value Date/Time   WBC 14.1 (H) 04/20/2019 0547   RBC 3.98 04/20/2019 0547   HGB 12.6 04/20/2019 0547   HCT 36.1 04/20/2019 0547   PLT 129 (L) 04/20/2019 0547   MCV 90.7 04/20/2019 0547   MCH 31.7 04/20/2019 0547   MCHC 34.9 04/20/2019 0547   RDW 13.6 04/20/2019 0547    BMET No results found for: NA, K, CL, CO2, GLUCOSE, BUN, CREATININE, CALCIUM, GFRNONAA, GFRAA  BNP No results found for: BNP  ProBNP No results found for: PROBNP  Specialty Problems   None  No Known Allergies  Immunization History  Administered Date(s) Administered   Influenza,inj,Quad PF,6+ Mos 02/19/2019   Tdap 03/30/2019    Past Medical History:  Diagnosis Date   Gestational diabetes     Tobacco History: Social History   Tobacco Use  Smoking Status Never  Smokeless Tobacco Never   Counseling given: Not Answered   Continue to not smoke  Outpatient Encounter Medications as of 01/03/2021  Medication Sig   budesonide-formoterol (SYMBICORT) 160-4.5 MCG/ACT inhaler Inhale 2 puffs into the lungs in the morning and at bedtime.   prenatal vitamin w/FE, FA (PRENATAL 1 + 1) 27-1 MG TABS tablet Take 1 tablet by mouth daily at 12 noon.   acetaminophen (TYLENOL) 325 MG tablet Take 2 tablets (  650 mg total) by mouth every 4 (four) hours as needed (for pain scale < 4). (Patient not taking: Reported on 01/03/2021)   clotrimazole (GYNE-LOTRIMIN) 1 % vaginal cream Place 1 Applicatorful vaginally at bedtime. (Patient not taking: No sig reported)   glucose blood test strip Test Blood Glucose 4 x a day. Fasting and 2 hours after breakfast, lunch and dinner. (Patient not taking: No sig reported)   glucose monitoring kit (FREESTYLE) monitoring kit 1 each by Does not apply route as needed for other. (Patient not taking: No sig reported)    Lancets (FREESTYLE) lancets Check Blood Glucose 4 x a day. Fasting and 2 hours after breakfast, lunch and dinner. (Patient not taking: No sig reported)   pantoprazole (PROTONIX) 40 MG tablet Take 40 mg by mouth daily. (Patient not taking: Reported on 01/03/2021)   UNABLE TO FIND Med Name: pt reports taking medicine for stomach acid but does not remember the name. (Patient not taking: Reported on 01/03/2021)   No facility-administered encounter medications on file as of 01/03/2021.     Review of Systems  Review of Systems  No chest pain with exertion.  Breathing worse when lying flat, orthopnea present.  No PND.  She denies lower extremity swelling.  Comprehensive review of systems otherwise negative. Physical Exam  BP 104/70 (BP Location: Right Arm, Patient Position: Sitting, Cuff Size: Normal)   Pulse 71   Temp 97.7 F (36.5 C) (Oral)   Ht _0  (1.575 m)   Wt 110 lb 12.8 oz (50.3 kg)   SpO2 98%   BMI 20.27 kg/m   Wt Readings from Last 5 Encounters:  01/03/21 110 lb 12.8 oz (50.3 kg)  06/30/19 116 lb 3.2 oz (52.7 kg)  05/25/19 114 lb 12.8 oz (52.1 kg)  04/19/19 127 lb (57.6 kg)  04/15/19 129 lb (58.5 kg)    BMI Readings from Last 5 Encounters:  01/03/21 20.27 kg/m  06/30/19 21.25 kg/m  05/25/19 21.00 kg/m  04/19/19 23.23 kg/m     Physical Exam General: sitting in exam chair, in NAD Eyes: EOMI, no icterus Neck: Supple, unable to adequately assess JVP given presence of hajab Pulmonary: Good excursion, clear, normal work of breathing Cardiovascular: Borderline tachycardia, regular  rhythm, no murmur Abdomen: Nondistended, bowel sounds present MSK: No synovitis, no joint effusion Neuro: Normal gait, no weakness Psych: Normal mood, flat affect   Assessment & Plan:   Dyspnea exertion: Chest x-ray is clear.  May be some gas trapping.  Possible asthma as she reports never smoking.  Do not see recent blood counts.  Obtain CBC today.  Will obtain PFTs for further  evaluation.  She does endorse orthopnea.  Consider echocardiogram in the future if work-up is unrevealing.  No signs of volume overload on exam today.  Cough, chest congestion: Concerned related to dyspnea on exertion as above.  Possible asthma.  She denies atopic symptoms however.  Trial Symbicort high-dose 2 puffs twice daily.    Return in about 3 months (around 04/04/2021).   Lanier Clam, MD 01/03/2021

## 2021-01-13 ENCOUNTER — Other Ambulatory Visit: Payer: Self-pay | Admitting: Pulmonary Disease

## 2021-01-13 DIAGNOSIS — R0600 Dyspnea, unspecified: Secondary | ICD-10-CM

## 2021-01-13 DIAGNOSIS — R0609 Other forms of dyspnea: Secondary | ICD-10-CM

## 2021-01-13 NOTE — Progress Notes (Signed)
t

## 2021-01-14 LAB — SARS CORONAVIRUS 2 (TAT 6-24 HRS): SARS Coronavirus 2: POSITIVE — AB

## 2021-01-17 NOTE — Progress Notes (Signed)
Can you call patient and tell her she tested positive for Covid and to cancel her PFT tomorrow. Thanks!

## 2021-01-25 ENCOUNTER — Telehealth: Payer: Self-pay | Admitting: Pulmonary Disease

## 2021-01-25 NOTE — Telephone Encounter (Signed)
disregard

## 2021-03-07 ENCOUNTER — Other Ambulatory Visit: Payer: Self-pay | Admitting: Pulmonary Disease

## 2021-03-07 LAB — SARS CORONAVIRUS 2 (TAT 6-24 HRS): SARS Coronavirus 2: NEGATIVE

## 2021-03-09 ENCOUNTER — Other Ambulatory Visit: Payer: Self-pay

## 2021-03-09 ENCOUNTER — Ambulatory Visit (INDEPENDENT_AMBULATORY_CARE_PROVIDER_SITE_OTHER): Payer: Medicaid Other | Admitting: Pulmonary Disease

## 2021-03-09 DIAGNOSIS — R0609 Other forms of dyspnea: Secondary | ICD-10-CM

## 2021-03-09 LAB — PULMONARY FUNCTION TEST
DL/VA % pred: 103 %
DL/VA: 4.71 ml/min/mmHg/L
DLCO cor % pred: 99 %
DLCO cor: 20.61 ml/min/mmHg
DLCO unc % pred: 99 %
DLCO unc: 20.61 ml/min/mmHg
FEF 25-75 Post: 2.7 L/sec
FEF 25-75 Pre: 2.64 L/sec
FEF2575-%Change-Post: 2 %
FEF2575-%Pred-Post: 86 %
FEF2575-%Pred-Pre: 84 %
FEV1-%Change-Post: 2 %
FEV1-%Pred-Post: 89 %
FEV1-%Pred-Pre: 87 %
FEV1-Post: 2.59 L
FEV1-Pre: 2.53 L
FEV1FVC-%Change-Post: 0 %
FEV1FVC-%Pred-Pre: 99 %
FEV6-%Change-Post: 0 %
FEV6-%Pred-Post: 88 %
FEV6-%Pred-Pre: 89 %
FEV6-Post: 3.04 L
FEV6-Pre: 3.06 L
FEV6FVC-%Pred-Post: 101 %
FEV6FVC-%Pred-Pre: 101 %
FVC-%Change-Post: 1 %
FVC-%Pred-Post: 89 %
FVC-%Pred-Pre: 88 %
FVC-Post: 3.11 L
FVC-Pre: 3.07 L
Post FEV1/FVC ratio: 83 %
Post FEV6/FVC ratio: 100 %
Pre FEV1/FVC ratio: 82 %
Pre FEV6/FVC Ratio: 100 %
RV % pred: 124 %
RV: 1.77 L
TLC % pred: 95 %
TLC: 4.56 L

## 2021-03-09 NOTE — Progress Notes (Signed)
PFT done today. 

## 2021-03-14 ENCOUNTER — Telehealth: Payer: Self-pay | Admitting: Pulmonary Disease

## 2021-03-14 NOTE — Telephone Encounter (Signed)
Call made to patient, daughter is not on DPR. Made aware MD will return next week and we will f/u at that time.    MH please advise on PFT results. Thanks :) Patient aware there will be a delayed response.

## 2021-03-20 NOTE — Telephone Encounter (Signed)
PFT largely normal, subtle signs of possible asthma present.

## 2021-03-20 NOTE — Telephone Encounter (Signed)
Called patient via Interpreter. She did not answer and I could not leave a VM.   Will attempt to call back later.

## 2021-03-21 NOTE — Telephone Encounter (Signed)
ATC pt via [place] interpre[employer]. Unable to leave VM. Pt needs OV.

## 2021-03-30 ENCOUNTER — Other Ambulatory Visit: Payer: Self-pay

## 2021-03-30 ENCOUNTER — Encounter: Payer: Self-pay | Admitting: Pulmonary Disease

## 2021-03-30 ENCOUNTER — Ambulatory Visit (INDEPENDENT_AMBULATORY_CARE_PROVIDER_SITE_OTHER): Payer: Medicaid Other | Admitting: Pulmonary Disease

## 2021-03-30 VITALS — BP 116/68 | HR 82 | Temp 97.6°F | Ht 62.0 in | Wt 108.0 lb

## 2021-03-30 DIAGNOSIS — R053 Chronic cough: Secondary | ICD-10-CM

## 2021-03-30 DIAGNOSIS — J4540 Moderate persistent asthma, uncomplicated: Secondary | ICD-10-CM | POA: Diagnosis not present

## 2021-03-30 MED ORDER — PREDNISONE 20 MG PO TABS: 20.0000 mg | ORAL_TABLET | Freq: Every day | ORAL | 0 refills | Status: AC

## 2021-03-30 MED ORDER — FLUTICASONE-SALMETEROL 500-50 MCG/ACT IN AEPB: 1.0000 | INHALATION_SPRAY | Freq: Two times a day (BID) | RESPIRATORY_TRACT | 6 refills | Status: AC

## 2021-03-30 NOTE — Progress Notes (Signed)
_0  ID: Lisa Fleming, female    DOB: 18-Nov-1985, 35 y.o.   MRN: [id number]  Chief Complaint  Patient presents with   Follow-up    Pt states that her breathing is not getting any better. No SHOB or anything. A little bit of difficulty breathing when she is walking     Referring provider: Lois Huxley, PA  HPI:   35 y.o. woman whom we are seeing in follow-up for evaluation of shortness of breath with PFTs demonstrating no fixed obstruction with gas trapping and normal DLCO likely reflective of small airways disease or asthma.  Patient continues to complain of mild dyspnea exertion.  Cough.  Worse in the evenings.  Prescribed Symbicort at last visit.  Used for a week.  Stopped it.  I am just now being informed of this.  Describes feeling weak, jittery.  Reviewed in detail the results of her PFTs which are suggestive of likely asthma.  Reviewed in detail the treatment of asthma and importance of maintenance inhaler.  Discussed occasional need for oral prednisone for exacerbations or uncontrolled symptoms.  Multiple questions were asked.  All answered to patient satisfaction.  The visit was conducted with the assistance of a in person interpreter.   HPI at initial visit: Patient reports 49-monthhistory of shortness of breath.  She cannot identify any precipitating event.  No illness, event.  No trauma.  Out of the blue.  Present with rest and exertion.  Not really worse with exertion.  Worse when she lies flat.  Occasional cough at night.  It is dry.  Not particular bothersome.  Feels like her chest is tight, heavy, hard to get a deep breath.  This occurs at rest.  Not worsened with exertion.  No seasonal or environmental factors she can identify to account for this.  No change in living environment.  No change in routine.  No time of day when things are better or worse.  She tried albuterol without significant improvement.  No other alleviating or exacerbating factors.  She denies seasonal  allergies.  Chest x-ray obtained 10/06/2020 by PCP for cough revealed clear lungs bilaterally, mildly flattened diaphragms possibly suggestive of gas trapping on my review and interpretation.  Hemoglobin 14 In 10/2019.  PMH: GERD Surgery: Reviewed patient, denies any surgical history Family history: Reviewed with patient, denies history of respiratory illnesses in first-degree relative Social history: [place] smoker[place]  03/30/2021  - Visit     Questionaires / Pulmonary Flowsheets:   ACT:  No flowsheet data found.  MMRC: mMRC Dyspnea Scale mMRC Score  01/03/2021 3    Epworth:  No flowsheet data found.  Tests:   FENO:  No results found for: NITRICOXIDE  PFT: PFT Results Latest Ref Rng & Units 03/09/2021  FVC-Pre L 3.07  FVC-Predicted Pre % 88  FVC-Post L 3.11  FVC-Predicted Post % 89  Pre FEV1/FVC % % 82  Post FEV1/FCV % % 83  FEV1-Pre L 2.53  FEV1-Predicted Pre % 87  FEV1-Post L 2.59  DLCO uncorrected ml/min/mmHg 20.61  DLCO UNC% % 99  DLCO corrected ml/min/mmHg 20.61  DLCO COR %Predicted % 99  DLVA Predicted % 103  TLC L 4.56  TLC % Predicted % 95  RV % Predicted % 124  Personally reviewed and interpreted as normal spirometry, no bronchodilator spots, RV elevated indicative of gas trapping, DLCO within normal limits-consistent with small airways disease or asthma.  WALK:  No flowsheet data  found.  Imaging: Personally reviewed and as per EMR discussion this note  Lab Results: Personally reviewed, most recent CBC without anemia CBC    Component Value Date/Time   WBC 4.5 01/03/2021 0939   RBC 4.79 01/03/2021 0939   HGB 13.6 01/03/2021 0939   HCT 40.7 01/03/2021 0939   PLT 240.0 01/03/2021 0939   MCV 85.1 01/03/2021 0939   MCH 31.7 04/20/2019 0547   MCHC 33.4 01/03/2021 0939   RDW 12.9 01/03/2021 0939   LYMPHSABS 1.9 01/03/2021 0939   MONOABS 0.4 01/03/2021 0939   EOSABS 0.1 01/03/2021 0939   BASOSABS 0.2  (H) 01/03/2021 0939    BMET No results found for: NA, K, CL, CO2, GLUCOSE, BUN, CREATININE, CALCIUM, GFRNONAA, GFRAA  BNP No results found for: BNP  ProBNP No results found for: PROBNP  Specialty Problems   None No Known Allergies  Immunization History  Administered Date(s) Administered   Influenza,inj,Quad PF,6+ Mos 02/19/2019   Tdap 10/11/2017, 03/30/2019    Past Medical History:  Diagnosis Date   Gestational diabetes     Tobacco History: Social History   Tobacco Use  Smoking Status Never  Smokeless Tobacco Never   Counseling given: Not Answered   Continue to not smoke  Outpatient Encounter Medications as of 03/30/2021  Medication Sig   acetaminophen (TYLENOL) 325 MG tablet Take 2 tablets (650 mg total) by mouth every 4 (four) hours as needed (for pain scale < 4).   clotrimazole (GYNE-LOTRIMIN) 1 % vaginal cream Place 1 Applicatorful vaginally at bedtime.   fluticasone-salmeterol (ADVAIR DISKUS) 500-50 MCG/ACT AEPB Inhale 1 puff into the lungs in the morning and at bedtime.   glucose blood test strip Test Blood Glucose 4 x a day. Fasting and 2 hours after breakfast, lunch and dinner.   glucose monitoring kit (FREESTYLE) monitoring kit 1 each by Does not apply route as needed for other.   Lancets (FREESTYLE) lancets Check Blood Glucose 4 x a day. Fasting and 2 hours after breakfast, lunch and dinner.   pantoprazole (PROTONIX) 40 MG tablet Take 40 mg by mouth daily.   predniSONE (DELTASONE) 20 MG tablet Take 1 tablet (20 mg total) by mouth daily with breakfast for 5 days.   prenatal vitamin w/FE, FA (PRENATAL 1 + 1) 27-1 MG TABS tablet Take 1 tablet by mouth daily at 12 noon.   UNABLE TO FIND Med Name: pt reports taking medicine for stomach acid but does not remember the name.   [DISCONTINUED] budesonide-formoterol (SYMBICORT) 160-4.5 MCG/ACT inhaler Inhale 2 puffs into the lungs in the morning and at bedtime. (Patient not taking: Reported on 03/30/2021)   No  facility-administered encounter medications on file as of 03/30/2021.     Review of Systems  Review of Systems  N/A Physical Exam  BP 116/68 (BP Location: Right Arm, Patient Position: Sitting, Cuff Size: Normal)   Pulse 82   Temp 97.6 F (36.4 C) (Oral)   Ht _0  (1.575 m)   Wt 108 lb (49 kg)   SpO2 99%   BMI 19.75 kg/m   Wt Readings from Last 5 Encounters:  03/30/21 108 lb (49 kg)  01/03/21 110 lb 12.8 oz (50.3 kg)  06/30/19 116 lb 3.2 oz (52.7 kg)  05/25/19 114 lb 12.8 oz (52.1 kg)  04/19/19 127 lb (57.6 kg)    BMI Readings from Last 5 Encounters:  03/30/21 19.75 kg/m  01/03/21 20.27 kg/m  06/30/19 21.25 kg/m  05/25/19 21.00 kg/m  04/19/19 23.23 kg/m  Physical Exam General: sitting in exam chair, in NAD Eyes: EOMI, no icterus Neck: Supple, unable to adequately assess JVP given presence of hajab Pulmonary: Good excursion, clear, normal work of breathing Cardiovascular: Borderline tachycardia, regular  rhythm, no murmur Abdomen: Nondistended, bowel sounds present MSK: No synovitis, no joint effusion Neuro: Normal gait, no weakness Psych: Normal mood, flat affect   Assessment & Plan:   Dyspnea exertion: Chest x-ray is clear with evidence of gas trapping or hyperinflation.  She is never smoker.  CBC without anemia.  PFTs are suggestive of asthma.  Did not tolerate Symbicort due to side effect.  Trial high-dose Advair as below.  Cough, chest congestion due to asthma: Concerned related to dyspnea on exertion as above.  Advair high-dose 2 puffs twice daily.  Given persistent symptoms, prednisone 20 mg daily x5 days.   Return in about 3 months (around 06/28/2021).   Lanier Clam, MD 03/30/2021

## 2021-03-30 NOTE — Patient Instructions (Addendum)
Nice to see you again  I am sorry the Symbicort caused side effects  Your breathing test shows signs of asthma or inflammation in the air tubes of the lungs  I recommend using Advair 1 puff twice a day.  Please rinse your mouth after every use.  This is a new inhaler but similar to Symbicort.  Use prednisone 20 mg (1 pill) once daily for the next 5 days.  I expect the pills to help improve your symptoms and the inhaler to keep your symptoms well or at a minimum for a long time  Return to clinic in 3 months or sooner as needed with Dr. Judeth Horn

## 2021-04-04 NOTE — Telephone Encounter (Signed)
Pt seen on 03/30/21. Will close encounter.

## 2021-04-12 ENCOUNTER — Other Ambulatory Visit: Payer: Self-pay

## 2021-04-12 ENCOUNTER — Ambulatory Visit (INDEPENDENT_AMBULATORY_CARE_PROVIDER_SITE_OTHER): Payer: Medicaid Other | Admitting: Primary Care

## 2021-04-12 ENCOUNTER — Encounter: Payer: Self-pay | Admitting: Primary Care

## 2021-04-12 VITALS — BP 112/68 | HR 91 | Temp 97.9°F | Ht 62.0 in | Wt 108.6 lb

## 2021-04-12 DIAGNOSIS — R0602 Shortness of breath: Secondary | ICD-10-CM | POA: Diagnosis not present

## 2021-04-12 DIAGNOSIS — J029 Acute pharyngitis, unspecified: Secondary | ICD-10-CM | POA: Diagnosis not present

## 2021-04-12 DIAGNOSIS — K219 Gastro-esophageal reflux disease without esophagitis: Secondary | ICD-10-CM | POA: Diagnosis not present

## 2021-04-12 DIAGNOSIS — J45909 Unspecified asthma, uncomplicated: Secondary | ICD-10-CM | POA: Insufficient documentation

## 2021-04-12 DIAGNOSIS — J4520 Mild intermittent asthma, uncomplicated: Secondary | ICD-10-CM | POA: Diagnosis not present

## 2021-04-12 LAB — CBC WITH DIFFERENTIAL/PLATELET
Basophils Absolute: 0.1 10*3/uL (ref 0.0–0.1)
Basophils Relative: 1.4 % (ref 0.0–3.0)
Eosinophils Absolute: 0.1 10*3/uL (ref 0.0–0.7)
Eosinophils Relative: 1.8 % (ref 0.0–5.0)
HCT: 42.4 % (ref 36.0–46.0)
Hemoglobin: 14.1 g/dL (ref 12.0–15.0)
Lymphocytes Relative: 40.8 % (ref 12.0–46.0)
Lymphs Abs: 2 10*3/uL (ref 0.7–4.0)
MCHC: 33.4 g/dL (ref 30.0–36.0)
MCV: 86.2 fl (ref 78.0–100.0)
Monocytes Absolute: 0.4 10*3/uL (ref 0.1–1.0)
Monocytes Relative: 8.9 % (ref 3.0–12.0)
Neutro Abs: 2.3 10*3/uL (ref 1.4–7.7)
Neutrophils Relative %: 47.1 % (ref 43.0–77.0)
Platelets: 196 10*3/uL (ref 150.0–400.0)
RBC: 4.92 Mil/uL (ref 3.87–5.11)
RDW: 13.8 % (ref 11.5–15.5)
WBC: 4.9 10*3/uL (ref 4.0–10.5)

## 2021-04-12 MED ORDER — OMEPRAZOLE 40 MG PO CPDR: 40.0000 mg | DELAYED_RELEASE_CAPSULE | Freq: Every day | ORAL | 1 refills | Status: DC

## 2021-04-12 NOTE — Patient Instructions (Addendum)
I suspect throat burning is from reflux, we will start you on medication called omeprazole 40mg  daily I will also refer you to Ear nose and throat specialist d/t throat pain   Recommendations: Start omeprazole 40mg  daily Continue Advair 1 puff morning and evening (rinse mouth after use)  Orders: Labs today  Referral: ENT re: pharyngitis   Follow-up: 1 month with Dr.    Food Choices for Gastroesophageal Reflux Disease, Adult When you have gastroesophageal reflux disease (GERD), the foods you eat and your eating habits are very important. Choosing the right foods can help ease the discomfort of GERD. Consider working with a dietitian to help you make healthy food choices. What are tips for following this plan? Reading food labels Look for foods that are low in saturated fat. Foods that have less than 5% of daily value (DV) of fat and 0 g of trans fats may help with your symptoms. Cooking Cook foods using methods other than frying. This may include baking, steaming, grilling, or broiling. These are all methods that do not need a lot of fat for cooking. To add flavor, try to use herbs that are low in spice and acidity. Meal planning  Choose healthy foods that are low in fat, such as fruits, vegetables, whole grains, low-fat dairy products, lean meats, fish, and poultry. Eat frequent, small meals instead of three large meals each day. Eat your meals slowly, in a relaxed setting. Avoid bending over or lying down until 2-3 hours after eating. Limit high-fat foods such as fatty meats or fried foods. Limit your intake of fatty foods, such as oils, butter, and shortening. Avoid the following as told by your health care provider: Foods that cause symptoms. These may be different for different people. Keep a food diary to keep track of foods that cause symptoms. Alcohol. Drinking large amounts of liquid with meals. Eating meals during the 2-3 hours before bed. Lifestyle Maintain a  healthy weight. Ask your health care provider what weight is healthy for you. If you need to lose weight, work with your health care provider to do so safely. Exercise for at least 30 minutes on 5 or more days each week, or as told by your health care provider. Avoid wearing clothes that fit tightly around your waist and chest. Do not use any products that contain nicotine or tobacco. These products include cigarettes, chewing tobacco, and vaping devices, such as e-cigarettes. If you need help quitting, ask your health care provider. Sleep with the head of your bed raised. Use a wedge under the mattress or blocks under the bed frame to raise the head of the bed. Chew sugar-free gum after mealtimes. What foods should I eat? Eat a healthy, well-balanced diet of fruits, vegetables, whole grains, low-fat dairy products, lean meats, fish, and poultry. Each person is different. Foods that may trigger symptoms in one person may not trigger any symptoms in another person. Work with your health care provider to identify foods that are safe for you. The items listed above may not be a complete list of recommended foods and beverages. Contact a dietitian for more information. What foods should I avoid? Limiting some of these foods may help manage the symptoms of GERD. Everyone is different. Consult a dietitian or your health care provider to help you identify the exact foods to avoid, if any. Fruits Any fruits prepared with added fat. Any fruits that cause symptoms. For some people this may include citrus fruits, such as oranges, grapefruit, pineapple,  and lemons. Vegetables Deep-fried vegetables. [place] fries. Any vegetables prepared with added fat. Any vegetables that cause symptoms. For some people, this may include tomatoes and tomato products, chili peppers, onions and garlic, and horseradish. Grains Pastries or quick breads with added fat. Meats and other proteins High-fat meats, such as fatty beef or  pork, hot dogs, ribs, ham, sausage, salami, and bacon. Fried meat or protein, including fried fish and fried chicken. Nuts and nut butters, in large amounts. Dairy Whole milk and chocolate milk. Sour cream. Cream. Ice cream. Cream cheese. Milkshakes. Fats and oils Butter. Margarine. Shortening. Ghee. Beverages Coffee and tea, with or without caffeine. Carbonated beverages. Sodas. Energy drinks. Fruit juice made with acidic fruits, such as orange or grapefruit. Tomato juice. Alcoholic drinks. Sweets and desserts Chocolate and cocoa. Donuts. Seasonings and condiments Pepper. Peppermint and spearmint. Added salt. Any condiments, herbs, or seasonings that cause symptoms. For some people, this may include curry, hot sauce, or vinegar-based salad dressings. The items listed above may not be a complete list of foods and beverages to avoid. Contact a dietitian for more information. Questions to ask your health care provider Diet and lifestyle changes are usually the first steps that are taken to manage symptoms of GERD. If diet and lifestyle changes do not improve your symptoms, talk with your health care provider about taking medicines. Where to find more information [employer] G[employer]intestinal [employer]: aboutgerd.org Summary When you have gastroesophageal reflux disease (GERD), food and lifestyle choices may be very helpful in easing the discomfort of GERD. Eat frequent, small meals instead of three large meals each day. Eat your meals slowly, in a relaxed setting. Avoid bending over or lying down until 2-3 hours after eating. Limit high-fat foods such as fatty meats or fried foods. This information is not intended to replace advice given to you by your health care provider. Make sure you discuss any questions you have with your health care provider. Document Revised: 10/19/2019 Document Reviewed: 10/19/2019 Elsevier Patient Education  2022 [employer].

## 2021-04-12 NOTE — Assessment & Plan Note (Addendum)
-   Symptoms are intermittent, do not improve with oral prednisone. Asthma symptoms do not appear exacerbated on exam. Concern reflux or VCD could be contributing with shortness of breath symptoms. We will start patient on PPI and refer to ENT for evaluation. Continue Advair 1 puff twice daily. Checking resp allergy panel, CBC with diff and D-dimer d.t sob.

## 2021-04-12 NOTE — Assessment & Plan Note (Addendum)
-   Patient complains of esophageal burning/discomfort. Clinical symptoms appear consistent with reflux. Recommend patient start omeprazole 40mg  daily x 4-6 weeks and follow GERD diet.

## 2021-04-12 NOTE — Progress Notes (Signed)
@Patient  ID: Lisa Fleming, female    DOB: 23-Jan-1986, 35 y.o.   MRN: [id number]  Chief Complaint  Patient presents with   Follow-up    SOB and difficulty breathing started 5 months ago. Smelling strong smells makes it worse but nothing seems to make it better     Referring provider: Lois Huxley, PA  HPI: 34 year old female, never smoked. PMH significant for moderate persistent asthma. Patient of Dr. Silas Flood, last seen on 03/30/21. Patient did not tolerate Symbicort d/t side effects. Trial high dose Advair.   04/12/2021- Interim hx Patient presents today for acute OV/shortness of breath. Accompanied by her daughter who is able to provide translation. She has had shortness of breath symptoms for 5 months. She does not get out of breath walking down hallway. She has a burning sensation in her chest and throat area.  Smells will trigger her symptoms. It is difficult for her to breath out. She has some intermittent wheezing and nocturnal cough. She is complaint with Advair 1 puff twice daily.  She is not currently taking pantoprazole. States that prednisone did not help. No change of pregnancy. Denies f/c/s, chest tightness, chest pain, purulent mucus.   No Known Allergies  Immunization History  Administered Date(s) Administered   Influenza,inj,Quad PF,6+ Mos 02/19/2019   Tdap 10/11/2017, 03/30/2019    Past Medical History:  Diagnosis Date   Gestational diabetes     Tobacco History: Social History   Tobacco Use  Smoking Status Never  Smokeless Tobacco Never   Counseling given: Not Answered   Outpatient Medications Prior to Visit  Medication Sig Dispense Refill   acetaminophen (TYLENOL) 325 MG tablet Take 2 tablets (650 mg total) by mouth every 4 (four) hours as needed (for pain scale < 4). 30 tablet 0   clotrimazole (GYNE-LOTRIMIN) 1 % vaginal cream Place 1 Applicatorful vaginally at bedtime. 30 g 2   fluticasone-salmeterol (ADVAIR DISKUS) 500-50 MCG/ACT AEPB Inhale 1  puff into the lungs in the morning and at bedtime. 60 each 6   glucose blood test strip Test Blood Glucose 4 x a day. Fasting and 2 hours after breakfast, lunch and dinner. 100 each 12   glucose monitoring kit (FREESTYLE) monitoring kit 1 each by Does not apply route as needed for other. 1 each 0   Lancets (FREESTYLE) lancets Check Blood Glucose 4 x a day. Fasting and 2 hours after breakfast, lunch and dinner. 100 each 12   prenatal vitamin w/FE, FA (PRENATAL 1 + 1) 27-1 MG TABS tablet Take 1 tablet by mouth daily at 12 noon. 30 tablet 2   UNABLE TO FIND Med Name: pt reports taking medicine for stomach acid but does not remember the name.     pantoprazole (PROTONIX) 40 MG tablet Take 40 mg by mouth daily.     No facility-administered medications prior to visit.   Review of Systems  Review of Systems  Constitutional: Negative.   HENT:  Positive for sore throat.   Respiratory:  Positive for cough, shortness of breath and wheezing. Negative for chest tightness.   Gastrointestinal:        Dyspepsia      Physical Exam  BP 112/68 (BP Location: Left Arm, Patient Position: Sitting, Cuff Size: Normal)    Pulse 91    Temp 97.9 F (36.6 C) (Oral)    Ht 5' 2"  (1.575 m)    Wt 108 lb 9.6 oz (49.3 kg)    SpO2 100%    BMI 19.86  kg/m  Physical Exam Constitutional:      Appearance: Normal appearance.  HENT:     Head: Normocephalic and atraumatic.     Mouth/Throat:     Mouth: Mucous membranes are moist.     Pharynx: Oropharynx is clear.  Cardiovascular:     Rate and Rhythm: Normal rate and regular rhythm.     Comments: RRR Pulmonary:     Effort: Pulmonary effort is normal.     Breath sounds: Normal breath sounds.     Comments: LSC Musculoskeletal:        General: Normal range of motion.  Skin:    General: Skin is warm and dry.  Neurological:     General: No focal deficit present.     Mental Status: She is alert and oriented to person, place, and time. Mental status is at baseline.   Psychiatric:        Mood and Affect: Mood normal.        Behavior: Behavior normal.        Thought Content: Thought content normal.        Judgment: Judgment normal.     Lab Results:  CBC    Component Value Date/Time   WBC 4.5 01/03/2021 0939   RBC 4.79 01/03/2021 0939   HGB 13.6 01/03/2021 0939   HCT 40.7 01/03/2021 0939   PLT 240.0 01/03/2021 0939   MCV 85.1 01/03/2021 0939   MCH 31.7 04/20/2019 0547   MCHC 33.4 01/03/2021 0939   RDW 12.9 01/03/2021 0939   LYMPHSABS 1.9 01/03/2021 0939   MONOABS 0.4 01/03/2021 0939   EOSABS 0.1 01/03/2021 0939   BASOSABS 0.2 (H) 01/03/2021 0939    BMET No results found for: NA, K, CL, CO2, GLUCOSE, BUN, CREATININE, CALCIUM, GFRNONAA, GFRAA  BNP No results found for: BNP  ProBNP No results found for: PROBNP  Imaging: No results found.   Assessment & Plan:   Asthma - Symptoms are intermittent, do not improve with oral prednisone. Asthma symptoms do not appear exacerbated on exam. Concern reflux or VCD could be contributing with shortness of breath symptoms. We will start patient on PPI and refer to ENT for evaluation. Continue Advair 566mg 1 puff twice daily. Checking resp allergy panel, CBC with diff and D-dimer d.t sob.  GERD (gastroesophageal reflux disease) - Patient complains of esophageal burning/discomfort. Clinical symptoms appear consistent with reflux. Recommend patient start omeprazole 461mdaily x 4-6 weeks and follow GERD diet.   ElMartyn EhrichNP 04/12/2021

## 2021-04-13 LAB — RESPIRATORY ALLERGY PROFILE REGION II ~~LOC~~

## 2021-04-13 LAB — INTERPRETATION:

## 2021-04-13 LAB — D-DIMER, QUANTITATIVE: D-Dimer, Quant: 0.34 mcg/mL FEU (ref <0.50)

## 2021-04-14 NOTE — Progress Notes (Signed)
Please let patient know labs (CBC, d-dimer, respiratory allergy panel) were normal.

## 2021-05-03 ENCOUNTER — Telehealth: Payer: Self-pay

## 2021-05-03 ENCOUNTER — Telehealth: Payer: Self-pay | Admitting: Primary Care

## 2021-05-03 DIAGNOSIS — J029 Acute pharyngitis, unspecified: Secondary | ICD-10-CM

## 2021-05-03 NOTE — Telephone Encounter (Signed)
BW please advise:    Called and spoke to patients daughter ( Got  verbal okay from pt) who states she needs a referral to ENT.

## 2021-05-03 NOTE — Telephone Encounter (Signed)
ATC patient in regards to her question about referral to ENT.

## 2021-05-03 NOTE — Telephone Encounter (Signed)
Yes please refer to ENT re: pharyngitis

## 2021-05-04 NOTE — Telephone Encounter (Signed)
Order has been placed for ENT referral, nothing further needed.

## 2021-05-16 ENCOUNTER — Telehealth: Payer: Self-pay | Admitting: Primary Care

## 2021-05-16 NOTE — Telephone Encounter (Signed)
Tried calling patient again to inform her that she has an active ENT referall in place and to find out if she has been contacted. Will attempt to call again

## 2021-05-16 NOTE — Telephone Encounter (Signed)
She has active referral. Has she not been contacted for appt?

## 2021-05-16 NOTE — Telephone Encounter (Signed)
Patient called and would like a referral to an ENT.   Please advise Dr Silas Flood

## 2021-05-22 ENCOUNTER — Ambulatory Visit: Payer: Medicaid Other | Admitting: Pulmonary Disease

## 2021-06-07 ENCOUNTER — Other Ambulatory Visit: Payer: Self-pay | Admitting: Physician Assistant

## 2021-06-21 ENCOUNTER — Other Ambulatory Visit: Payer: Self-pay | Admitting: Primary Care

## 2022-09-06 ENCOUNTER — Other Ambulatory Visit: Payer: Self-pay

## 2023-07-19 ENCOUNTER — Encounter (HOSPITAL_COMMUNITY): Payer: Self-pay

## 2023-07-19 ENCOUNTER — Ambulatory Visit (HOSPITAL_COMMUNITY)
Admission: EM | Admit: 2023-07-19 | Discharge: 2023-07-19 | Disposition: A | Discharge status: Home / Self Care | Payer: Self-pay | Attending: Family Medicine | Admitting: Family Medicine

## 2023-07-19 ENCOUNTER — Ambulatory Visit (INDEPENDENT_AMBULATORY_CARE_PROVIDER_SITE_OTHER): Payer: Self-pay

## 2023-07-19 DIAGNOSIS — S93402A Sprain of unspecified ligament of left ankle, initial encounter: Secondary | ICD-10-CM

## 2023-07-19 NOTE — Discharge Instructions (Signed)
 Your imaging did not show any fractures or breaks.  You do have a sprain of your ankle, you most likely stretched out a ligament.  Please do the exercises we talked about that are attached to the discharge paper to help strengthen your ankle.  We have given you an Ace wrap in clinic to help provide compression and support.  For any pain you can take 800 mg of ibuprofen every 8 hours.  You can also ice and elevate your ankle.  Follow-up with orthopedic if your pain does not improve over the next few weeks, or you develop any new concerning symptoms.

## 2023-07-19 NOTE — ED Triage Notes (Signed)
 Patient's daughter is interpreting.  Patient was going down steps and missed the last step, twisting her left ankle.  Patient has not had any medication for her symptoms

## 2023-07-19 NOTE — ED Provider Notes (Addendum)
 [facility]    CSN: [id number] Arrival date & time: 07/19/23  1034      History   Chief Complaint Chief Complaint  Patient presents with   Ankle Injury    HPI Lisa Fleming is a 38 y.o. female.   Patient presents to clinic with daughter for concern of left ankle and foot pain after injury three days ago. She was walking down some steps when she missed the last step and her ankle rolled inward.  She has been ambulatory since then, only has pain when weightbearing.  Mild swelling.  Has not tried any medications or interventions for her symptoms.  Without previous injury to this ankle.   Patient speaks Arabic.  Daughter providing interpretation.  Patient declined language interpreter.  The history is provided by the patient and medical records.  Ankle Injury    Past Medical History:  Diagnosis Date   Gestational diabetes     Patient Active Problem List   Diagnosis Date Noted   Asthma 04/12/2021   GERD (gastroesophageal reflux disease) 04/12/2021   Vitamin D deficiency 07/03/2020   Presence of IUD 05/26/2019   History of preterm premature rupture of membranes (PPROM) 04/19/2019   Language barrier 04/09/2019   History of gestational diabetes 02/19/2019    Past Surgical History:  Procedure Laterality Date   CHOLECYSTECTOMY  2018    OB History     Gravida  5   Para  4   Term  4   Preterm  0   AB  1   Living  4      SAB  1   IAB  0   Ectopic  0   Multiple  0   Live Births  4            Home Medications    Prior to Admission medications   Medication Sig Start Date End Date Taking? Authorizing Provider  acetaminophen (TYLENOL) 325 MG tablet Take 2 tablets (650 mg total) by mouth every 4 (four) hours as needed (for pain scale < 4). 04/20/19   Sparacino, Hailey L, DO  clotrimazole (GYNE-LOTRIMIN) 1 % vaginal cream Place 1 Applicatorful vaginally at bedtime. 04/10/19   Conan Bowens, MD  fluticasone-salmeterol (ADVAIR DISKUS)  500-50 MCG/ACT AEPB Inhale 1 puff into the lungs in the morning and at bedtime. 03/30/21   Hunsucker, Lesia Sago, MD  glucose blood test strip Test Blood Glucose 4 x a day. Fasting and 2 hours after breakfast, lunch and dinner. 04/02/19   Reva Bores, MD  glucose monitoring kit (FREESTYLE) monitoring kit 1 each by Does not apply route as needed for other. 04/02/19   Reva Bores, MD  Lancets (FREESTYLE) lancets Check Blood Glucose 4 x a day. Fasting and 2 hours after breakfast, lunch and dinner. 04/02/19   Reva Bores, MD  omeprazole (PRILOSEC) 40 MG capsule TAKE 1 CAPSULE(40 MG) BY MOUTH DAILY 06/21/21   Glenford Bayley, NP  prenatal vitamin w/FE, FA (PRENATAL 1 + 1) 27-1 MG TABS tablet Take 1 tablet by mouth daily at 12 noon. 04/13/19   Conan Bowens, MD  UNABLE TO FIND Med Name: pt reports taking medicine for stomach acid but does not remember the name.    [provider]    Family History Family History  Problem Relation Age of Onset   Diabetes Mother    Heart disease Father     Social History Social History   Tobacco Use   Smoking  status: Never   Smokeless tobacco: Never  Vaping Use   Vaping status: Never Used  Substance Use Topics   Alcohol use: Never   Drug use: Never     Allergies   Patient has no known allergies.   Review of Systems Review of Systems  Per HPI  Physical Exam Triage Vital Signs ED Triage Vitals [07/19/23 1055]  Encounter Vitals Group     BP 102/67     Systolic BP Percentile      Diastolic BP Percentile      Pulse Rate 77     Resp 14     Temp 98.1 F (36.7 C)     Temp Source Oral     SpO2 97 %     Weight      Height      Head Circumference      Peak Flow      Pain Score 8     Pain Loc      Pain Education      Exclude from Growth Chart    No data found.  Updated Vital Signs BP 102/67 (BP Location: Right Arm)   Pulse 77   Temp 98.1 F (36.7 C) (Oral)   Resp 14   SpO2 97%   Visual Acuity Right Eye Distance:    Left Eye Distance:   Bilateral Distance:    Right Eye Near:   Left Eye Near:    Bilateral Near:     Physical Exam Vitals and nursing note reviewed.  Constitutional:      Appearance: Normal appearance.  HENT:     Head: Normocephalic and atraumatic.     Right Ear: External ear normal.     Left Ear: External ear normal.     Nose: Nose normal.  Eyes:     Conjunctiva/sclera: Conjunctivae normal.  Cardiovascular:     Rate and Rhythm: Normal rate.     Pulses: Normal pulses.          Dorsalis pedis pulses are 2+ on the left side.       Posterior tibial pulses are 2+ on the left side.  Pulmonary:     Effort: Pulmonary effort is normal. No respiratory distress.  Musculoskeletal:        General: Swelling, tenderness and signs of injury present. No deformity.       Feet:  Skin:    General: Skin is warm and dry.     Capillary Refill: Capillary refill takes less than 2 seconds.  Neurological:     General: No focal deficit present.     Mental Status: She is alert.  Psychiatric:        Mood and Affect: Mood normal.      UC Treatments / Results  Labs (all labs ordered are listed, but only abnormal results are displayed) Labs Reviewed - No data to display  EKG   Radiology DG Ankle Complete Left Result Date: 07/19/2023 CLINICAL DATA:  Left ankle twisting injury. EXAM: LEFT ANKLE COMPLETE - 3+ VIEW COMPARISON:  None Available. FINDINGS: There is no evidence of fracture, dislocation, or joint effusion. There is no evidence of arthropathy or other focal bone abnormality. Soft tissues are unremarkable. IMPRESSION: Negative. Electronically Signed   By: Obie Dredge M.D.   On: 07/19/2023 12:30    Procedures Procedures (including critical care time)  Medications Ordered in UC Medications - No data to display  Initial Impression / Assessment and Plan / UC Course  I have reviewed the  triage vital signs and the nursing notes.  Pertinent labs & imaging results that were  available during my care of the patient were reviewed by me and considered in my medical decision making (see chart for details).  Vitals and triage reviewed, patient is hemodynamically stable.  Left ankle injury, is weightbearing with discomfort.  Without significant swelling or deformity.  Brisk capillary refill distal to injury.  Imaging does not show any obvious fractures or deformities by my interpretation.  Will provide Ace wrap and discussed symptomatic management for ankle sprain.  Orthopedic follow-up encouraged if symptoms persist.  Plan of care, follow-up care return precautions given, no questions at this time.  Radiology over-read is negative, no updates to treatment plan at this time.    Final Clinical Impressions(s) / UC Diagnoses   Final diagnoses:  Sprain of left ankle, unspecified ligament, initial encounter     Discharge Instructions      Your imaging did not show any fractures or breaks.  You do have a sprain of your ankle, you most likely stretched out a ligament.  Please do the exercises we talked about that are attached to the discharge paper to help strengthen your ankle.  We have given you an Ace wrap in clinic to help provide compression and support.  For any pain you can take 800 mg of ibuprofen every 8 hours.  You can also ice and elevate your ankle.  Follow-up with orthopedic if your pain does not improve over the next few weeks, or you develop any new concerning symptoms.     ED Prescriptions   None    PDMP not reviewed this encounter.   Zaid Tomes, [place] N, FNP 07/19/23 1154    Oluwafemi Villella, [place] N, [place] 07/19/23 [phone]

## 2024-05-19 ENCOUNTER — Ambulatory Visit: Payer: Self-pay | Admitting: Dermatology

## 2024-05-29 ENCOUNTER — Ambulatory Visit: Admitting: Dermatology

## 2024-05-29 ENCOUNTER — Encounter: Payer: Self-pay | Admitting: Dermatology

## 2024-05-29 DIAGNOSIS — L821 Other seborrheic keratosis: Secondary | ICD-10-CM

## 2024-05-29 DIAGNOSIS — L219 Seborrheic dermatitis, unspecified: Secondary | ICD-10-CM

## 2024-05-29 MED ORDER — CLOBETASOL PROPIONATE 0.05 % EX CREA: 1.0000 | TOPICAL_CREAM | Freq: Two times a day (BID) | CUTANEOUS | 6 refills | Status: AC

## 2024-05-29 MED ORDER — KETOCONAZOLE 2 % EX SHAM: 1.0000 | MEDICATED_SHAMPOO | Freq: Once | CUTANEOUS | 6 refills | Status: AC

## 2024-05-29 NOTE — Progress Notes (Unsigned)
" ° °  New Patient Visit   Patient (and/or pt guardian) consented to the use of AI-assisted tools for note generation.  Subjective  Lisa Fleming is a 39 y.o. female who presents for the following: Dermatitis - patient is accompanied by her daughter  Located at the back and under left that she would like to have examined.  Patient reports the areas have been there for 2 years She reports the areas are bothersome. Patient reports that the areas sometimes but always itch When they itch, patient rates this 7/10 She states that the areas have not spread.  Patient reports she has not previously been treated for these areas. Patient reports that she has not tried any OTC medcations Patient states that she uses Aveeno moisturizer and Dove soap  The following portions of the chart were reviewed this encounter and updated as appropriate: medications, allergies, medical history  Review of Systems:  No other skin or systemic complaints except as noted in HPI or Assessment and Plan.  Objective  Well appearing patient in no apparent distress; mood and affect are within normal limits.  A focused examination was performed of the following areas: Back and left eye  Relevant exam findings are noted in the Assessment and Plan.          Assessment & Plan   SEBORRHEIC KERATOSIS - Stuck-on, waxy, tan-brown papules and/or plaques  - Benign-appearing - Discussed benign etiology and prognosis. - Observe - Call for any changes - Prescribed clobetasol  0.05% to use twice daily for two weeks and then stop then as needed for itch - Recommended CeraVe anti-itch moisturizer     SEBORRHEIC DERMATITIS Exam: Pink patches with greasy scale at back and under left eye  Seborrheic Dermatitis is a chronic persistent rash characterized by pinkness and scaling most commonly of the mid face but also can occur on the scalp (dandruff), ears; mid chest, mid back and groin.  It tends to be exacerbated by stress  and cooler weather.  People who have neurologic disease may experience new onset or exacerbation of existing seborrheic dermatitis.  The condition is not curable but treatable and can be controlled.  Treatment Plan: Prescribed Ketoconazole  2% shampoo  SEBORRHEIC DERMATITIS   This Visit - ketoconazole  (NIZORAL ) 2 % shampoo - Apply 1 Application topically once for 1 dose. SEBORRHEIC KERATOSES   This Visit - clobetasol  cream (TEMOVATE ) 0.05 % - Apply 1 Application topically 2 (two) times daily. 1 application twice daily for two weeks or as needed for rash  Return in about 1 year (around 05/29/2025) for SKs and Seb derm f/u.  LILLETTE Lyle Cords, am acting as a neurosurgeon for [employer], DO .   Documentation: I have reviewed the above documentation for accuracy and completeness, and I agree with the above.  Delon Lenis, DO     "

## 2024-05-29 NOTE — Patient Instructions (Addendum)
 ???? ???????:  ?????? ????? ????? ??????? ????? ??? ???? ?????? ???????? ??? ????? ???? ????? ???? ????? ????. ?? ????? ????? ??????? ????? ??? ???? ?????? ?????? ????? ????? ??? ???? ?????? ????? ??????. ??? ????? ?? ?????? ?????? ???? ???????? ?? ???? ??? ???????.  ??? ??????:  - ???????? ???????: ???????? ??????? ?? ????? ????? (??? ???????) ??? ????? ?? ???? ?????. ??? ??? ????? ??? ????? ??? ????? ?????. ?????? ?? ?????? ??? ??????? ???? ?????? ?????? ????? ??????. ???????? ??? ???? ?? ??? ???? ?????????? ?? ????????? ????? ?? ???? ????? ???????. ?????? ?????? ???? ???????? ?? ???? ?? ???????? ???? ??????? ??? ???????? ??? ??? ?????.  - ?????? ????? ??????: ?????? ????? ?????? ???? ????? ???? ???? ?????? ??? ?????? ?? ????? ??????. ????? ??? ??????? ?? ??? ????? ??????????? ??. ?????? ??????? ??? ?????????? ?????? ???? ???????  ??? ????? ????? ?? ??????? ???? ????? ??? ??? ???? ??????.  ?????????:  ???? ????? ??????? ??????? ???? ?????? ?????? ????? ????? ?????????? ????? ??????? ??????? ?????? ??????????? ????? ?????? ????? ??????. ??? ???? ????? ?? ????????? ?? ??? ?? ????? ???????? ???? ????? ???? ????????.    VISIT SUMMARY:  Today, we discussed your itchy spots on your face and back, as well as the increase in dandruff you have been experiencing. We identified the itchy spots as seborrheic keratoses and the dandruff as seborrheic dermatitis. We have provided recommendations and prescriptions to help manage these conditions.  YOUR PLAN:  -SEBORRHEIC KERATOSES: Seborrheic keratoses are benign (non-cancerous) growths on the skin that can cause itchiness. They are not dangerous and do not turn into skin cancer. To manage the itchiness, you should use CeraVe anti-itch lotion daily. Additionally, you have been prescribed clobetasol  cream to use at night for intense itchiness. Apply the cream for two weeks, then take a break for two weeks before resuming if needed.  -SEBORRHEIC DERMATITIS:  Seborrheic dermatitis is a chronic condition that causes dandruff and can be worsened by cold weather. To manage this, you have been prescribed ketoconazole  shampoo. Use the shampoo as directed, letting it sit for two minutes before rinsing, and make sure to condition your hair afterward to prevent dryness.  INSTRUCTIONS:  Please follow the instructions for using the CeraVe anti-itch lotion and clobetasol  cream for your seborrheic keratoses, and the ketoconazole  shampoo for your seborrheic dermatitis. If you have any questions or if your symptoms do not improve, please schedule a follow-up appointment.   Important Information  Due to recent changes in healthcare laws, you may see results of your pathology and/or laboratory studies on MyChart before the doctors have had a chance to review them. We understand that in some cases there may be results that are confusing or concerning to you. Please understand that not all results are received at the same time and often the doctors may need to interpret multiple results in order to provide you with the best plan of care or course of treatment. Therefore, we ask that you please give us  2 business days to thoroughly review all your results before contacting the office for clarification. Should we see a critical lab result, you will be contacted sooner.   If You Need Anything After Your Visit  If you have any questions or concerns for your doctor, please call our main line at [phone] If no one answers, please leave a voicemail as directed and we will return your call as soon as possible. Messages left after 4 pm will be answered the following business day.  You may also send us  a message via MyChart. We typically respond to MyChart messages within 1-2 business days.  For prescription refills, please ask your pharmacy to contact our office. Our fax number is [phone].  If you have an urgent issue when the clinic is closed that cannot wait until  the next business day, you can page your doctor at the number below.    Please note that while we do our best to be available for urgent issues outside of office hours, we are not available 24/7.   If you have an urgent issue and are unable to reach us , you may choose to seek medical care at your doctor's office, retail clinic, urgent care center, or emergency room.  If you have a medical emergency, please immediately call 911 or go to the emergency department. In the event of inclement weather, please call our main line at [phone] for an update on the status of any delays or closures.  Dermatology Medication Tips: Please keep the boxes that topical medications come in in order to help keep track of the instructions about where and how to use these. Pharmacies typically print the medication instructions only on the boxes and not directly on the medication tubes.   If your medication is too expensive, please contact our office at [phone] or send us  a message through MyChart.   We are unable to tell what your co-pay for medications will be in advance as this is different depending on your insurance coverage. However, we may be able to find a substitute medication at lower cost or fill out paperwork to get insurance to cover a needed medication.   If a prior authorization is required to get your medication covered by your insurance company, please allow us  1-2 business days to complete this process.  Drug prices often vary depending on where the prescription is filled and some pharmacies may offer cheaper prices.  The website www.goodrx.com contains coupons for medications through different pharmacies. The prices here do not account for what the cost may be with help from insurance (it may be cheaper with your insurance), but the website can give you the price if you did not use any insurance.  - You can print the associated coupon and take it with your prescription to the pharmacy.  -  You may also stop by our office during regular business hours and pick up a GoodRx coupon card.  - If you need your prescription sent electronically to a different pharmacy, notify our office through [facility] or by phone at [phone]

## 2025-02-23 ENCOUNTER — Ambulatory Visit: Admitting: Dermatology
